# Patient Record
Sex: Female | Born: 2000 | Hispanic: No | Marital: Single | Smoking: Never smoker
Health system: Southern US, Community
[De-identification: ages and names within clinical notes are randomized; demographics above are authoritative.]

## PROBLEM LIST (undated history)

## (undated) DIAGNOSIS — D649 Anemia, unspecified: Secondary | ICD-10-CM

---
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
## 2019-10-17 ENCOUNTER — Inpatient Hospital Stay (HOSPITAL_COMMUNITY)
Admission: AD | Admit: 2019-10-17 | Discharge: 2019-10-18 | Disposition: A | Discharge status: Home / Self Care | Payer: Medicaid Other | Attending: Obstetrics and Gynecology | Admitting: Obstetrics and Gynecology

## 2019-10-17 ENCOUNTER — Encounter (HOSPITAL_COMMUNITY): Payer: Self-pay

## 2019-10-17 ENCOUNTER — Other Ambulatory Visit: Payer: Self-pay

## 2019-10-17 DIAGNOSIS — O209 Hemorrhage in early pregnancy, unspecified: Secondary | ICD-10-CM

## 2019-10-17 DIAGNOSIS — O200 Threatened abortion: Secondary | ICD-10-CM

## 2019-10-17 DIAGNOSIS — Z3A11 11 weeks gestation of pregnancy: Secondary | ICD-10-CM | POA: Insufficient documentation

## 2019-10-17 DIAGNOSIS — Z793 Long term (current) use of hormonal contraceptives: Secondary | ICD-10-CM | POA: Insufficient documentation

## 2019-10-17 DIAGNOSIS — O4691 Antepartum hemorrhage, unspecified, first trimester: Secondary | ICD-10-CM | POA: Diagnosis not present

## 2019-10-17 LAB — POCT PREGNANCY, URINE: Preg Test, Ur: POSITIVE — AB

## 2019-10-17 NOTE — MAU Note (Signed)
Pt states that she has seen pinkish brownish blood 2 times today.   She states she is having abdominal cramping.

## 2019-10-17 NOTE — ED Triage Notes (Signed)
Patient reports vaginal spotting this evening with mild low abdominal discomfort/cramping . She is [redacted] weeks pregnant G1P0 , ambulatory , denies fever , consulted MAU nurse advised triage RN that pt. can be transported to MAU for evaluation .

## 2019-10-18 ENCOUNTER — Inpatient Hospital Stay (HOSPITAL_COMMUNITY): Payer: Medicaid Other

## 2019-10-18 DIAGNOSIS — Z3A11 11 weeks gestation of pregnancy: Secondary | ICD-10-CM

## 2019-10-18 DIAGNOSIS — O200 Threatened abortion: Secondary | ICD-10-CM

## 2019-10-18 DIAGNOSIS — O209 Hemorrhage in early pregnancy, unspecified: Secondary | ICD-10-CM

## 2019-10-18 LAB — URINALYSIS, ROUTINE W REFLEX MICROSCOPIC
Bilirubin Urine: NEGATIVE
Glucose, UA: NEGATIVE mg/dL
Ketones, ur: NEGATIVE mg/dL
Nitrite: NEGATIVE
Protein, ur: 30 mg/dL — AB
Specific Gravity, Urine: 1.025 (ref 1.005–1.030)
pH: 6 (ref 5.0–8.0)

## 2019-10-18 LAB — WET PREP, GENITAL
Sperm: NONE SEEN
Trich, Wet Prep: NONE SEEN
Yeast Wet Prep HPF POC: NONE SEEN

## 2019-10-18 LAB — ABO/RH: ABO/RH(D): O POS

## 2019-10-18 LAB — CBC
HCT: 39.1 % (ref 36.0–46.0)
Hemoglobin: 13.3 g/dL (ref 12.0–15.0)
MCH: 30.5 pg (ref 26.0–34.0)
MCHC: 34 g/dL (ref 30.0–36.0)
MCV: 89.7 fL (ref 80.0–100.0)
Platelets: 259 10*3/uL (ref 150–400)
RBC: 4.36 MIL/uL (ref 3.87–5.11)
RDW: 12.1 % (ref 11.5–15.5)
WBC: 9.6 10*3/uL (ref 4.0–10.5)
nRBC: 0 % (ref 0.0–0.2)

## 2019-10-18 LAB — HCG, QUANTITATIVE, PREGNANCY: hCG, Beta Chain, Quant, S: 5732 m[IU]/mL — ABNORMAL HIGH (ref <5)

## 2019-10-18 NOTE — MAU Provider Note (Signed)
History     CSN: [id number]  Arrival date and time: 10/17/19 2300   First Provider Initiated Contact with Patient 10/18/19 0023      Chief Complaint  Patient presents with  . Vaginal Bleeding  . Abdominal Pain   Joanna Oliver is a 18 y.o. G1P0 at [redacted]w[redacted]d by Definite LMP of Sept 18, 2020. She plans to complete her [facility] in [facility] and her first appt is on Dec 16th.  She presents today for Vaginal Bleeding and Abdominal Pain.  She states she note spotting "a couple of hours ago" and intermittent cramping throughout the day.  She states that she did not require a pad and that the blood was "kinda brownish...like a light brown."  She did not note any blood or discharge upon arrival.  She reports the cramping is in her lower stomach and has no aggravating or relieving factors.  She denies taking any medications for the cramping.  Patient also denies sexual activity in the past three days.      OB History    Gravida  1   Para      Term      Preterm      AB      Living        SAB      TAB      Ectopic      Multiple      Live Births              History reviewed. No pertinent past medical history.  Past Surgical History:  Procedure Laterality Date  . WISDOM TOOTH EXTRACTION  2017    History reviewed. No pertinent family history.  Social History   Tobacco Use  . Smoking status: Never Smoker  . Smokeless tobacco: Never Used  Substance Use Topics  . Alcohol use: Never    Frequency: Never  . Drug use: Never    Allergies: No Known Allergies  Medications Prior to Admission  Medication Sig Dispense Refill Last Dose  . benzonatate (TESSALON) 100 MG capsule Take 1 capsule (100 mg total) by mouth every 8 (eight) hours. (Patient not taking: Reported on 01/18/2018) 21 capsule 0   . medroxyPROGESTERone (DEPO-PROVERA) 150 MG/ML injection Inject 150 mg into the muscle every 3 (three) months.     Marland Kitchen oseltamivir (TAMIFLU) 75 MG capsule Take 1 capsule (75 mg  total) by mouth every 12 (twelve) hours. (Patient not taking: Reported on 01/18/2018) 10 capsule 0     Review of Systems  Constitutional: Negative for chills and fever.  Respiratory: Negative for cough and shortness of breath.   Gastrointestinal: Positive for abdominal pain. Negative for constipation, diarrhea, nausea and vomiting.  Genitourinary: Positive for vaginal bleeding and vaginal discharge ("Regular discharge"). Negative for difficulty urinating, dysuria and pelvic pain.  Musculoskeletal: Positive for back pain (Lower ).  Neurological: Negative for dizziness, light-headedness and headaches.   Physical Exam   Blood pressure 110/65, pulse 71, temperature 98.6 F (37 C), resp. rate 12, weight 45.5 kg, last menstrual period 07/27/2019.  Physical Exam  Constitutional: She is oriented to person, place, and time. She appears well-developed and well-nourished. No distress.  HENT:  Head: Normocephalic and atraumatic.  Eyes: Conjunctivae are normal.  Neck: Normal range of motion.  Cardiovascular: Normal rate.  Respiratory: Effort normal.  Genitourinary: Uterus is enlarged and tender. Cervix exhibits no motion tenderness, no discharge and no friability.    Vaginal bleeding present.     No vaginal  discharge.  There is bleeding in the vagina.    Genitourinary Comments: Sterile Speculum Exam: -Normal External Genitalia: Non tender, no apparent discharge at introitus.  -Vaginal Vault: Pink mucosa with good rugae. Moderate amt brownish red blood noted and removed with faux swab x 4 -wet prep collected -Cervix:Pink, no lesions, cysts, or polyps.  Appears closed. No active bleeding from os-GC/CT collected -Bimanual Exam:  Anterverted uterus enlarged but not c/w dates, feels ~6-8 week size.  Tender on posterior (vaginal) side per patient.     Musculoskeletal: Normal range of motion.  Neurological: She is alert and oriented to person, place, and time.  Skin: Skin is warm and dry.   Psychiatric: She has a normal mood and affect. Her behavior is normal.    MAU Course  Procedures Results for orders placed or performed during the hospital encounter of 10/17/19 (from the past 24 hour(s))  Urinalysis, Routine w reflex microscopic     Status: Abnormal   Collection Time: 10/17/19 11:44 PM  Result Value Ref Range   Color, Urine YELLOW YELLOW   APPearance HAZY (A) CLEAR   Specific Gravity, Urine 1.025 1.005 - 1.030   pH 6.0 5.0 - 8.0   Glucose, UA NEGATIVE NEGATIVE mg/dL   Hgb urine dipstick MODERATE (A) NEGATIVE   Bilirubin Urine NEGATIVE NEGATIVE   Ketones, ur NEGATIVE NEGATIVE mg/dL   Protein, ur 30 (A) NEGATIVE mg/dL   Nitrite NEGATIVE NEGATIVE   Leukocytes,Ua TRACE (A) NEGATIVE   RBC / HPF 0-5 0 - 5 RBC/hpf   WBC, UA 6-10 0 - 5 WBC/hpf   Bacteria, UA RARE (A) NONE SEEN   Squamous Epithelial / LPF 11-20 0 - 5   Mucus PRESENT   Pregnancy, urine POC     Status: Abnormal   Collection Time: 10/17/19 11:44 PM  Result Value Ref Range   Preg Test, Ur POSITIVE (A) NEGATIVE  ABO/Rh     Status: None   Collection Time: 10/18/19 12:08 AM  Result Value Ref Range   ABO/RH(D) O POS    No rh immune globuloin      NOT A RH IMMUNE GLOBULIN CANDIDATE, PT RH POSITIVE Performed at [facility], [address], [place], [place] [address]   CBC     Status: None   Collection Time: 10/18/19 12:08 AM  Result Value Ref Range   WBC 9.6 4.0 - 10.5 K/uL   RBC 4.36 3.87 - 5.11 MIL/uL   Hemoglobin 13.3 12.0 - 15.0 g/dL   HCT 39.1 36.0 - 46.0 %   MCV 89.7 80.0 - 100.0 fL   MCH 30.5 26.0 - 34.0 pg   MCHC 34.0 30.0 - 36.0 g/dL   RDW 12.1 11.5 - 15.5 %   Platelets 259 150 - 400 K/uL   nRBC 0.0 0.0 - 0.2 %  Wet prep, genital     Status: Abnormal   Collection Time: 10/18/19 12:37 AM  Result Value Ref Range   Yeast Wet Prep HPF POC NONE SEEN NONE SEEN   Trich, Wet Prep NONE SEEN NONE SEEN   Clue Cells Wet Prep HPF POC PRESENT (A) NONE SEEN   WBC, Wet Prep HPF POC FEW (A)  NONE SEEN   Sperm NONE SEEN   hCG, quantitative, pregnancy     Status: Abnormal   Collection Time: 10/18/19 12:40 AM  Result Value Ref Range   hCG, Beta Chain, Quant, S 5,732 (H) <5 mIU/mL   US OB LESS THAN 14 WEEKS WITH OB TRANSVAGINAL  Result Date: 10/18/2019 CLINICAL DATA:  Pregnant patient in first-trimester pregnancy with vaginal bleeding. Gestational age based on last menstrual period 11 weeks 6 days. EXAM: OBSTETRIC <14 WK US AND TRANSVAGINAL OB US TECHNIQUE: Both transabdominal and transvaginal ultrasound examinations were performed for complete evaluation of the gestation as well as the maternal uterus, adnexal regions, and pelvic cul-de-sac. Transvaginal technique was performed to assess early pregnancy. COMPARISON:  None. FINDINGS: Intrauterine gestational sac: Single Yolk sac:  Not Visualized. Embryo:  Not Visualized. Cardiac Activity: Not Visualized. MSD: 18.6 mm   6 w   5 d Subchorionic hemorrhage:  Moderate, surrounding the gestational sac. Maternal uterus/adnexae: Single intrauterine gestational sac in the endometrial canal, slightly irregular in shape. No definite yolk sac or fetal pole. There is a possible amniotic membrane and internal debris. There is also fluid in the endocervical canal. The right ovary appears normal measuring 3.0 x 2.0 x 1.5 cm with blood flow. The left ovary appears normal measuring 1.3 x 2.7 x 1.6 cm with blood flow. No suspicious adnexal mass. No pelvic free fluid. IMPRESSION: 1. Single intrauterine gestational sac in the endometrial canal with mean sac diameter of 18.6 mm. The gestational sac is irregular in shape with possible amniotic membrane. No yolk sac or fetal pole. This is discordant with gestational age based on last menstrual period. Findings are suspicious but not yet definitive for failed pregnancy. Recommend follow-up US in 10-14 days for definitive diagnosis. This recommendation follows SRU consensus guidelines: Diagnostic Criteria for Nonviable  Pregnancy Early in the First Trimester. Malva Limes Engl J Med 2013; 161:0960-45; 369:1443-51. 2. Moderate subchorionic hemorrhage. Small amount of fluid in the endocervical canal. 3. No adnexal mass or findings to suggest ectopic pregnancy. Electronically Signed   By: Narda RutherfordMelanie  Sanford M.D.   On: 10/18/2019 03:01    MDM Pelvic Exam; Wet Prep and GC/CT Labs: UA, UPT, CBC, hCG, ABO Ultrasound Pain Medication Assessment and Plan  18 year old G1P0 at 11.6weeks Vaginal Bleeding  -Labs ordered and collected. -Reviewed POC with patient. -Patient offered and declines pain medication.  -Exam performed and findings discussed.  -Patient informed that uterus size is not c/w with dates. -Will add on hCG to labs -Will send for US and await results.   Cherre RobinsJessica L Rayne Cowdrey, MSN, CNM 10/18/2019, 12:23 AM   Reassessment (2:19 AM)-Late Entry d/t Downtime  -hCG Pending. -Patient informed that d/t downtime there is a delay, but should arrive soon. -Patient also informed of preliminary ultrasound results.  -Discussed how hCG would allow us to correlate and decide on plan of care. -Will await results.   Reassessment (3:05 AM)  -hCG and Final US results return. -In room to discuss findings with patient and FOB. -Informed that findings are consistent with miscarriage in setting of definite LMP by patient.  However, discussed need for follow up hCG on Saturday for definite determination. -Patient given option to follow up at her primary ob office or with [facility] and states she is unsure. -Patient instructed to report to MAU for repeat testing and procedure reviewed.  -Order placed for repeat quant for Saturday Dec 12th. -Questions answered regarding what causes miscarriage. -FOB visibly upset, but patient coping well and has no questions. -Patient and FOB without further questions or concerns. -Bleeding Precautions reviewed. -Encouraged to call or return to MAU if symptoms worsen or with the onset of new symptoms. -Discharged to  home in stable condition.  Cherre RobinsJessica L Mayan Kloepfer MSN, CNM Advanced Practice Provider, [facility] for [facility]

## 2019-10-18 NOTE — Progress Notes (Signed)
Will return to MAU Sat for repeat BHCG or sooner for any concerns

## 2019-10-18 NOTE — Discharge Instructions (Signed)
Threatened Miscarriage  A threatened miscarriage occurs when a woman has vaginal bleeding during the first 20 weeks of pregnancy but the pregnancy has not ended. If you have vaginal bleeding during this time, your health care provider will do tests to make sure you are still pregnant. If the tests show that you are still pregnant and that the developing baby (fetus) inside your uterus is still growing, your condition is considered a threatened miscarriage. A threatened miscarriage does not mean your pregnancy will end, but it does increase the risk of losing your pregnancy (complete miscarriage). What are the causes? The cause of this condition is usually not known. For women who go on to have a complete miscarriage, the most common cause is an abnormal number of chromosomes in the developing baby. Chromosomes are the structures inside cells that hold all of a person's genetic material. What increases the risk? The following lifestyle factors may increase your risk of a miscarriage in early pregnancy:  Smoking.  Drinking excessive amounts of alcohol or caffeine.  Recreational drug use. The following preexisting health conditions may increase your risk of a miscarriage in early pregnancy:  Polycystic ovary syndrome.  Uterine fibroids.  Infections.  Diabetes mellitus. What are the signs or symptoms? Symptoms of this condition include:  Vaginal bleeding.  Mild abdominal pain or cramps. How is this diagnosed? If you have bleeding with or without abdominal pain before 20 weeks of pregnancy, your health care provider will do tests to check whether you are still pregnant. These will include:  Ultrasound. This test uses sound waves to create images of the inside of your uterus. This allows your health care provider to look at your developing baby and other structures, such as your placenta.  Pelvic exam. This is an internal exam of your vagina and cervix.  Measurement of your baby's  heart rate.  Laboratory tests such as blood tests, urine tests, or swabs for infection You may be diagnosed with a threatened miscarriage if:  Ultrasound testing shows that you are still pregnant.  Your babys heart rate is strong.  A pelvic exam shows that the opening between your uterus and your vagina (cervix) is closed.  Blood tests confirm that you are still pregnant. How is this treated? No treatments have been shown to prevent a threatened miscarriage from going on to a complete miscarriage. However, the right home care is important. Follow these instructions at home:  Get plenty of rest.  Do not have sex or use tampons if you have vaginal bleeding.  Do not douche.  Do not smoke or use recreational drugs.  Do not drink alcohol.  Avoid caffeine.  Keep all follow-up prenatal visits as told by your health care provider. This is important. Contact a health care provider if:  You have light vaginal bleeding or spotting while pregnant.  You have abdominal pain or cramping.  You have a fever. Get help right away if:  You have heavy vaginal bleeding.  You have blood clots coming from your vagina.  You pass tissue from your vagina.  You leak fluid, or you have a gush of fluid from your vagina.  You have severe low back pain or abdominal cramps.  You have fever, chills, and severe abdominal pain. Summary  A threatened miscarriage occurs when a woman has vaginal bleeding during the first 20 weeks of pregnancy but the pregnancy has not ended.  The cause of a threatened miscarriage is usually not known.  Symptoms of this condition  may include vaginal bleeding and mild abdominal pain or cramps.  No treatments have been shown to prevent a threatened miscarriage from going on to a complete miscarriage.  Keep all follow-up prenatal visits as told by your health care provider. This is important. This information is not intended to replace advice given to you by your  health care provider. Make sure you discuss any questions you have with your health care provider. Document Released: 10/25/2005 Document Revised: 12/01/2017 Document Reviewed: 01/21/2017 Elsevier Patient Education  2020 [place] for [place] at [facility]       Phone: [phone]  Center for [place] at [place] Phone: [phone]  Center for [employer] at [place]  Phone: [place] for [place] at [facility]  Phone: [place] for [place] at [place]  Phone: [facility]       Phone: [phone]  [facility]    Phone: [phone]   [facility] [place])    Phone: [facility]    Phone: [phone]  [facility]    Phone: [place] [facility]  Phone: [place]    Phone: [phone]  [facility]   Phone: [phone]  [facility]    Phone: [phone]

## 2019-10-18 NOTE — Progress Notes (Signed)
Jessica EMly CNM in earlier to discuss test results and d/c plan. Written and verbal d/c instructions given and understanding voiced. 

## 2019-10-19 LAB — GC/CHLAMYDIA PROBE AMP (~~LOC~~) NOT AT ARMC
Chlamydia: NEGATIVE
Comment: NEGATIVE
Comment: NORMAL
Neisseria Gonorrhea: NEGATIVE

## 2019-10-20 ENCOUNTER — Encounter (HOSPITAL_COMMUNITY): Payer: Self-pay | Admitting: Family Medicine

## 2019-10-20 ENCOUNTER — Inpatient Hospital Stay (HOSPITAL_COMMUNITY)
Admission: AD | Admit: 2019-10-20 | Discharge: 2019-10-20 | Disposition: A | Discharge status: Home / Self Care | Payer: Medicaid Other | Attending: Family Medicine | Admitting: Family Medicine

## 2019-10-20 ENCOUNTER — Other Ambulatory Visit: Payer: Self-pay

## 2019-10-20 DIAGNOSIS — Z3A12 12 weeks gestation of pregnancy: Secondary | ICD-10-CM | POA: Insufficient documentation

## 2019-10-20 DIAGNOSIS — O039 Complete or unspecified spontaneous abortion without complication: Secondary | ICD-10-CM | POA: Insufficient documentation

## 2019-10-20 DIAGNOSIS — O99331 Smoking (tobacco) complicating pregnancy, first trimester: Secondary | ICD-10-CM | POA: Insufficient documentation

## 2019-10-20 LAB — HCG, QUANTITATIVE, PREGNANCY: hCG, Beta Chain, Quant, S: 3663 m[IU]/mL — ABNORMAL HIGH (ref <5)

## 2019-10-20 MED ORDER — TRAMADOL HCL 50 MG PO TABS
100.0000 mg | ORAL_TABLET | Freq: Once | ORAL | Status: AC
  Administered 2019-10-20: 100 mg via ORAL
  Filled 2019-10-20: qty 2

## 2019-10-20 MED ORDER — TRAMADOL HCL 50 MG PO TABS: 50.0000 mg | ORAL_TABLET | Freq: Four times a day (QID) | ORAL | 0 refills | Status: DC | PRN

## 2019-10-20 NOTE — MAU Note (Signed)
Joanna Oliver is a 18 y.o. at [redacted]w[redacted]d here in MAU reporting: here for repeat hcg but is also reporting that she is having more severe abdominal pain since this morning. States her bleeding has also been getting heavier since her last visit. Is wearing a pad and has changed it 1 time today, has not seen any blood clots but does think she may have passed some tissue.  Onset of complaint: progressively getting worse  Pain score: 10/10  Vitals:   10/20/19 1016  BP: (!) 97/59  Pulse: 100  Resp: 20  Temp: 97.9 F (36.6 C)  SpO2: 100%     Lab orders placed from triage: hcg

## 2019-10-20 NOTE — MAU Provider Note (Signed)
Chief Complaint: Abdominal Pain, Vaginal Bleeding, and Follow-up   First Provider Initiated Contact with Patient 10/20/19 1040      SUBJECTIVE HPI: Joanna Oliver is a 18 y.o. G1P0 at [redacted]w[redacted]d by sure LMP who presents to maternity admissions for repeat hcg but reports increased pain and vaginal bleeding today. She initially presented on 10/17/09 with spotting and cramping in early pregnancy and had sure pregnancy dates with quant hcg of 5,732 and [place] with irregular gestational sac, no yolk sac or fetal pole.  Pt reports bleeding and pain increased today and she desires something to treat the pain in MAU.  She has soaked 1 pad with dark bleeding, small clots, and possibly some tissue today.  There are no other symptoms. She has not tried any treatments at home.    HPI  History reviewed. No pertinent past medical history. Past Surgical History:  Procedure Laterality Date  . WISDOM TOOTH EXTRACTION  2017   Social History   Socioeconomic History  . Marital status: Single    Spouse name: Not on file  . Number of children: Not on file  . Years of education: Not on file  . Highest education level: Not on file  Occupational History  . Not on file  Tobacco Use  . Smoking status: Current Some Day Smoker  . Smokeless tobacco: Never Used  Substance and Sexual Activity  . Alcohol use: Never  . Drug use: Never  . Sexual activity: Yes    Birth control/protection: None  Other Topics Concern  . Not on file  Social History Narrative  . Not on file   Social Determinants of Health   Financial Resource Strain:   . Difficulty of Paying Living Expenses: Not on file  Food Insecurity:   . Worried About Programme researcher, broadcasting/film/video in the Last Year: Not on file  . Ran Out of Food in the Last Year: Not on file  Transportation Needs:   . Lack of Transportation (Medical): Not on file  . Lack of Transportation (Non-Medical): Not on file  Physical Activity:   . Days of Exercise per Week: Not on file  . Minutes of  Exercise per Session: Not on file  Stress:   . Feeling of Stress : Not on file  Social Connections:   . Frequency of Communication with Friends and Family: Not on file  . Frequency of Social Gatherings with Friends and Family: Not on file  . Attends Religious Services: Not on file  . Active Member of Clubs or Organizations: Not on file  . Attends Banker Meetings: Not on file  . Marital Status: Not on file  Intimate Partner Violence:   . Fear of Current or Ex-Partner: Not on file  . Emotionally Abused: Not on file  . Physically Abused: Not on file  . Sexually Abused: Not on file   No current facility-administered medications on file prior to encounter.   No current outpatient medications on file prior to encounter.   No Known Allergies  ROS:  Review of Systems  Constitutional: Negative for chills, fatigue and fever.  Respiratory: Negative for shortness of breath.   Cardiovascular: Negative for chest pain.  Gastrointestinal: Positive for abdominal pain.  Genitourinary: Positive for pelvic pain and vaginal bleeding. Negative for difficulty urinating, dysuria, flank pain, vaginal discharge and vaginal pain.  Neurological: Negative for dizziness and headaches.  Psychiatric/Behavioral: Negative.      I have reviewed patient's Past Medical Hx, Surgical Hx, Family Hx, Social Hx, medications  and allergies.   Physical Exam   Patient Vitals for the past 24 hrs:  BP Temp Temp src Pulse Resp SpO2 Height Weight  10/20/19 1034 112/76 -- -- 78 -- -- -- --  10/20/19 1016 (!) 97/59 97.9 F (36.6 C) Oral 100 20 100 % 5\' 4"  (1.626 m) 45.3 kg   Constitutional: Well-developed, well-nourished female in no acute distress.  Cardiovascular: normal rate Respiratory: normal effort GI: Abd soft, non-tender. Pos BS x 4 MS: Extremities nontender, no edema, normal ROM Neurologic: Alert and oriented x 4.  GU: Neg CVAT.  PELVIC EXAM: Moderate bleeding with 2 fox swabs used to  visualize cervix, cervix visually 1-2 cm with large gray clot in cervical os, clot removed with ring forceps and cervix visually closed, bleeding minimal,  vaginal walls and external genitalia normal    LAB RESULTS Results for orders placed or performed during the hospital encounter of 10/20/19 (from the past 24 hour(s))  hCG, quantitative, pregnancy     Status: Abnormal   Collection Time: 10/20/19 10:10 AM  Result Value Ref Range   hCG, Beta Chain, Quant, S 3,663 (H) <5 mIU/mL    --/--/O POS (12/10 0008)  IMAGING US OB LESS THAN 14 WEEKS WITH OB TRANSVAGINAL  Result Date: 10/18/2019 CLINICAL DATA:  Pregnant patient in first-trimester pregnancy with vaginal bleeding. Gestational age based on last menstrual period 11 weeks 6 days. EXAM: OBSTETRIC <14 WK US AND TRANSVAGINAL OB US TECHNIQUE: Both transabdominal and transvaginal ultrasound examinations were performed for complete evaluation of the gestation as well as the maternal uterus, adnexal regions, and pelvic cul-de-sac. Transvaginal technique was performed to assess early pregnancy. COMPARISON:  None. FINDINGS: Intrauterine gestational sac: Single Yolk sac:  Not Visualized. Embryo:  Not Visualized. Cardiac Activity: Not Visualized. MSD: 18.6 mm   6 w   5 d Subchorionic hemorrhage:  Moderate, surrounding the gestational sac. Maternal uterus/adnexae: Single intrauterine gestational sac in the endometrial canal, slightly irregular in shape. No definite yolk sac or fetal pole. There is a possible amniotic membrane and internal debris. There is also fluid in the endocervical canal. The right ovary appears normal measuring 3.0 x 2.0 x 1.5 cm with blood flow. The left ovary appears normal measuring 1.3 x 2.7 x 1.6 cm with blood flow. No suspicious adnexal mass. No pelvic free fluid. IMPRESSION: 1. Single intrauterine gestational sac in the endometrial canal with mean sac diameter of 18.6 mm. The gestational sac is irregular in shape with possible  amniotic membrane. No yolk sac or fetal pole. This is discordant with gestational age based on last menstrual period. Findings are suspicious but not yet definitive for failed pregnancy. Recommend follow-up US in 10-14 days for definitive diagnosis. This recommendation follows SRU consensus guidelines: Diagnostic Criteria for Nonviable Pregnancy Early in the First Trimester. Malva Limes Engl J Med 2013; 161:0960-45; 369:1443-51. 2. Moderate subchorionic hemorrhage. Small amount of fluid in the endocervical canal. 3. No adnexal mass or findings to suggest ectopic pregnancy. Electronically Signed   By: Narda RutherfordMelanie  Sanford M.D.   On: 10/18/2019 03:01    MAU Management/MDM: Orders Placed This Encounter  Procedures  . hCG, quantitative, pregnancy  . Discharge patient    Meds ordered this encounter  Medications  . traMADol (ULTRAM) tablet 100 mg  . traMADol (ULTRAM) 50 MG tablet    Sig: Take 1 tablet (50 mg total) by mouth every 6 (six) hours as needed.    Dispense:  10 tablet    Refill:  0    Order  Specific Question:   Supervising Provider    Answer:   Donnamae Jude [[id number]]    Likely POCs removed on exam in MAU and pt bleeding/pain decreased afterwards. Tramadol 100 mg was given in MAU which resolved remainder of pt pain. Rx for Tramadol 50 mg Q 6 hours PRN x 10 tabs sent to pharmacy.  Hcg with significant drop today from 5732 to 3663 today.  Discussed results with pt, questions answered.  F/U with hcg in 1 week and provider visit in 2 weeks.  Message sent to establish care with [facility]. Pt discharged with strict return precautions.  ASSESSMENT 1. SAB (spontaneous abortion)     PLAN Discharge home Allergies as of 10/20/2019   No Known Allergies     Medication List    TAKE these medications   traMADol 50 MG tablet Commonly known as: ULTRAM Take 1 tablet (50 mg total) by mouth every 6 (six) hours as needed.      Follow-up Information    [facility] Follow up.   Why: The office will call you for  2 visits, a lab visit in 1 week and provider visit in 2 weeks. Return to MAU with worsening symptoms.  Contact information: [place] [phone] [place] Certified Nurse-Midwife 10/20/2019  12:12 PM

## 2019-10-24 LAB — SURGICAL PATHOLOGY

## 2019-10-26 ENCOUNTER — Other Ambulatory Visit: Payer: Medicaid Other

## 2019-10-30 ENCOUNTER — Encounter (HOSPITAL_COMMUNITY): Payer: Self-pay | Admitting: Family Medicine

## 2019-10-30 ENCOUNTER — Inpatient Hospital Stay (HOSPITAL_COMMUNITY): Payer: Medicaid Other

## 2019-10-30 ENCOUNTER — Other Ambulatory Visit: Payer: Self-pay

## 2019-10-30 ENCOUNTER — Inpatient Hospital Stay (HOSPITAL_COMMUNITY)
Admission: AD | Admit: 2019-10-30 | Discharge: 2019-10-30 | Disposition: A | Discharge status: Home / Self Care | Payer: Medicaid Other | Attending: Family Medicine | Admitting: Family Medicine

## 2019-10-30 DIAGNOSIS — O209 Hemorrhage in early pregnancy, unspecified: Secondary | ICD-10-CM | POA: Diagnosis not present

## 2019-10-30 DIAGNOSIS — F17200 Nicotine dependence, unspecified, uncomplicated: Secondary | ICD-10-CM | POA: Diagnosis not present

## 2019-10-30 DIAGNOSIS — O039 Complete or unspecified spontaneous abortion without complication: Secondary | ICD-10-CM | POA: Insufficient documentation

## 2019-10-30 DIAGNOSIS — N939 Abnormal uterine and vaginal bleeding, unspecified: Secondary | ICD-10-CM | POA: Diagnosis present

## 2019-10-30 DIAGNOSIS — O99331 Smoking (tobacco) complicating pregnancy, first trimester: Secondary | ICD-10-CM | POA: Diagnosis not present

## 2019-10-30 DIAGNOSIS — Z3A13 13 weeks gestation of pregnancy: Secondary | ICD-10-CM | POA: Insufficient documentation

## 2019-10-30 LAB — URINALYSIS, ROUTINE W REFLEX MICROSCOPIC
Bacteria, UA: NONE SEEN
Bilirubin Urine: NEGATIVE
Glucose, UA: NEGATIVE mg/dL
Ketones, ur: NEGATIVE mg/dL
Nitrite: NEGATIVE
Protein, ur: 30 mg/dL — AB
RBC / HPF: >50 RBC/hpf — ABNORMAL HIGH (ref 0–5)
Specific Gravity, Urine: 1.011 (ref 1.005–1.030)
pH: 7 (ref 5.0–8.0)

## 2019-10-30 LAB — CBC
HCT: 41.3 % (ref 36.0–46.0)
Hemoglobin: 13.6 g/dL (ref 12.0–15.0)
MCH: 29.6 pg (ref 26.0–34.0)
MCHC: 32.9 g/dL (ref 30.0–36.0)
MCV: 90 fL (ref 80.0–100.0)
Platelets: 283 10*3/uL (ref 150–400)
RBC: 4.59 MIL/uL (ref 3.87–5.11)
RDW: 11.9 % (ref 11.5–15.5)
WBC: 9.1 10*3/uL (ref 4.0–10.5)
nRBC: 0 % (ref 0.0–0.2)

## 2019-10-30 LAB — HCG, QUANTITATIVE, PREGNANCY: hCG, Beta Chain, Quant, S: 420 m[IU]/mL — ABNORMAL HIGH (ref <5)

## 2019-10-30 MED ORDER — MISOPROSTOL 200 MCG PO TABS: 800.0000 ug | ORAL_TABLET | Freq: Once | ORAL | 0 refills | Status: DC

## 2019-10-30 NOTE — MAU Provider Note (Signed)
Chief Complaint: Vaginal Bleeding and Miscarriage   First Provider Initiated Contact with Patient 10/30/19 1840     SUBJECTIVE HPI: Joanna Oliver is a 18 y.o. G1P0 at [redacted]w[redacted]d who presents to Maternity Admissions reporting vaginal bleeding and abdominal pain. Has had bleeding since 12/10. During an MAU visit on 12/12 she was diagnosed with a miscarriage due to a drop in her HCG. During that visit, she passed tissue; pathology report showed that is was decidual tissue, not chorionic villi.  States she had heavy amount of bleeding and passed a large clot today. Has not been saturating pads. Denies fever/chills. Symptoms have worsened somewhat since passing the clot today.   Location: abdomen Quality: cramping Severity: 4/10 on pain scale Duration: 2 weeks Timing: intermittent Modifying factors: none Associated signs and symptoms: vaginal bleeding  History reviewed. No pertinent past medical history. OB History  Gravida Para Term Preterm AB Living  1       1    SAB TAB Ectopic Multiple Live Births  1            # Outcome Date GA Lbr Len/2nd Weight Sex Delivery Anes PTL Lv  1 SAB 10/2019           Past Surgical History:  Procedure Laterality Date  . WISDOM TOOTH EXTRACTION  2017   Social History   Socioeconomic History  . Marital status: Single    Spouse name: Not on file  . Number of children: Not on file  . Years of education: Not on file  . Highest education level: Not on file  Occupational History  . Not on file  Tobacco Use  . Smoking status: Current Some Day Smoker  . Smokeless tobacco: Never Used  Substance and Sexual Activity  . Alcohol use: Never  . Drug use: Never  . Sexual activity: Yes    Birth control/protection: None  Other Topics Concern  . Not on file  Social History Narrative  . Not on file   Social Determinants of Health   Financial Resource Strain:   . Difficulty of Paying Living Expenses: Not on file  Food Insecurity:   . Worried About Brewing technologist in the Last Year: Not on file  . Ran Out of Food in the Last Year: Not on file  Transportation Needs:   . Lack of Transportation (Medical): Not on file  . Lack of Transportation (Non-Medical): Not on file  Physical Activity:   . Days of Exercise per Week: Not on file  . Minutes of Exercise per Session: Not on file  Stress:   . Feeling of Stress : Not on file  Social Connections:   . Frequency of Communication with Friends and Family: Not on file  . Frequency of Social Gatherings with Friends and Family: Not on file  . Attends Religious Services: Not on file  . Active Member of Clubs or Organizations: Not on file  . Attends Banker Meetings: Not on file  . Marital Status: Not on file  Intimate Partner Violence:   . Fear of Current or Ex-Partner: Not on file  . Emotionally Abused: Not on file  . Physically Abused: Not on file  . Sexually Abused: Not on file   History reviewed. No pertinent family history. No current facility-administered medications on file prior to encounter.   Current Outpatient Medications on File Prior to Encounter  Medication Sig Dispense Refill  . Prenatal Vit-Fe Fumarate-FA (PRENATAL MULTIVITAMIN) TABS tablet Take 1 tablet by mouth  daily at 12 noon.    . traMADol (ULTRAM) 50 MG tablet Take 1 tablet (50 mg total) by mouth every 6 (six) hours as needed. 10 tablet 0   No Known Allergies  I have reviewed patient's Past Medical Hx, Surgical Hx, Family Hx, Social Hx, medications and allergies.   Review of Systems  Constitutional: Negative.   Gastrointestinal: Positive for abdominal pain.  Genitourinary: Positive for vaginal bleeding.    OBJECTIVE Patient Vitals for the past 24 hrs:  BP Temp Temp src Pulse Resp SpO2 Height Weight  10/30/19 1826 109/68 98.1 F (36.7 C) Oral 93 16 100 % 5\' 4"  (1.626 m) 45.1 kg   Constitutional: Well-developed, well-nourished female in no acute distress.  Cardiovascular: normal rate & rhythm, no  murmur Respiratory: normal rate and effort. Lung sounds clear throughout GI: Abd soft, non-tender, Pos BS x 4. No guarding or rebound tenderness MS: Extremities nontender, no edema, normal ROM Neurologic: Alert and oriented x 4.     LAB RESULTS Results for orders placed or performed during the hospital encounter of 10/30/19 (from the past 24 hour(s))  CBC     Status: None   Collection Time: 10/30/19  6:55 PM  Result Value Ref Range   WBC 9.1 4.0 - 10.5 K/uL   RBC 4.59 3.87 - 5.11 MIL/uL   Hemoglobin 13.6 12.0 - 15.0 g/dL   HCT 16.141.3 09.636.0 - 04.546.0 %   MCV 90.0 80.0 - 100.0 fL   MCH 29.6 26.0 - 34.0 pg   MCHC 32.9 30.0 - 36.0 g/dL   RDW 40.911.9 81.111.5 - 91.415.5 %   Platelets 283 150 - 400 K/uL   nRBC 0.0 0.0 - 0.2 %    IMAGING US OB Transvaginal  Result Date: 10/30/2019 CLINICAL DATA:  Pregnant patient with vaginal bleeding. EXAM: TRANSVAGINAL OB ULTRASOUND TECHNIQUE: Transvaginal ultrasound was performed for complete evaluation of the gestation as well as the maternal uterus, adnexal regions, and pelvic cul-de-sac. COMPARISON:  Ob ultrasound 10/18/2019. FINDINGS: Intrauterine gestational sac: Not visualized. Endometrial measures 0.8 cm in thickness and is somewhat heterogeneous with areas of flow on Doppler imaging. Yolk sac:  Not visualized. Embryo:  Not visualized. Cardiac Activity: Not applicable. Subchorionic hemorrhage:  None visualized. Maternal uterus/adnexae: Negative. IMPRESSION: Previously seen intrauterine pregnancy is no longer present most consistent with spontaneous abortion. The endometrium is not thickened but there is some flow within it on Doppler imaging which may reflect incomplete abortion/abortion in progress. Electronically Signed   By: Drusilla Kannerhomas  Dalessio M.D.   On: 10/30/2019 19:42    MAU COURSE Orders Placed This Encounter  Procedures  . US OB Transvaginal  . Urinalysis, Routine w reflex microscopic  . CBC  . hCG, quantitative, pregnancy  . Discharge patient   Meds  ordered this encounter  Medications  . misoprostol (CYTOTEC) 200 MCG tablet    Sig: Take 4 tablets (800 mcg total) by mouth once for 1 dose.    Dispense:  4 tablet    Refill:  0    Order Specific Question:   Supervising Provider    Answer:   ERVIN, MICHAEL L [[id number]]    MDM VSS CBC, HCG, & ultrasound ordered RH positive  Pt declines pain medication at this time Vital signs look good & hemoglobin stable. IUP no longer seen on ultrasound. Endometrium not thickened.   Discussed with patient. Offered cytotec to clear out uterus. Pt agreeable. Has appt at [facility] next week for SAB follow up.   ASSESSMENT 1. Miscarriage  2. Vaginal bleeding in pregnancy, first trimester     PLAN Discharge home in stable condition. Bleeding & infection precautions Rx cytotec Keep appt next week  Follow-up Information    [facility] Follow up.   Specialty: Obstetrics and Gynecology Why: return for worsening symptoms Contact information: [address] [phone] [place] [phone]         Allergies as of 10/30/2019   No Known Allergies     Medication List    TAKE these medications   misoprostol 200 MCG tablet Commonly known as: CYTOTEC Take 4 tablets (800 mcg total) by mouth once for 1 dose.   prenatal multivitamin Tabs tablet Take 1 tablet by mouth daily at 12 noon.   traMADol 50 MG tablet Commonly known as: ULTRAM Take 1 tablet (50 mg total) by mouth every 6 (six) hours as needed.        Jorje Guild, NP 10/30/2019  7:52 PM

## 2019-10-30 NOTE — MAU Note (Signed)
.   Joanna Oliver is a 18 y.o. at [redacted]w[redacted]d here in MAU reporting: moderate amount of bright red blood on pad. Patient reporting cramps.  Pain score: 4 Vitals:   10/30/19 1826  BP: 109/68  Pulse: 93  Resp: 16  Temp: 98.1 F (36.7 C)  SpO2: 100%

## 2019-10-30 NOTE — Discharge Instructions (Signed)
Miscarriage °A miscarriage is the loss of an unborn baby (fetus) before the 20th week of pregnancy. Most miscarriages happen during the first 3 months of pregnancy. Sometimes, a miscarriage can happen before a woman knows that she is pregnant. °Having a miscarriage can be an emotional experience. If you have had a miscarriage, talk with your health care provider about any questions you may have about miscarrying, the grieving process, and your plans for future pregnancy. °What are the causes? °A miscarriage may be caused by: °· Problems with the genes or chromosomes of the fetus. These problems make it impossible for the baby to develop normally. They are often the result of random errors that occur early in the development of the baby, and are not passed from parent to child (not inherited). °· Infection of the cervix or uterus. °· Conditions that affect hormone balance in the body. °· Problems with the cervix, such as the cervix opening and thinning before pregnancy is at term (cervical insufficiency). °· Problems with the uterus. These may include: °? A uterus with an abnormal shape. °? Fibroids in the uterus. °? Congenital abnormalities. These are problems that were present at birth. °· Certain medical conditions. °· Smoking, drinking alcohol, or using drugs. °· Injury (trauma). °In many cases, the cause of a miscarriage is not known. °What are the signs or symptoms? °Symptoms of this condition include: °· Vaginal bleeding or spotting, with or without cramps or pain. °· Pain or cramping in the abdomen or lower back. °· Passing fluid, tissue, or blood clots from the vagina. °How is this diagnosed? °This condition may be diagnosed based on: °· A physical exam. °· Ultrasound. °· Blood tests. °· Urine tests. °How is this treated? °Treatment for a miscarriage is sometimes not necessary if you naturally pass all the tissue that was in your uterus. If necessary, this condition may be treated with: °· Dilation and  curettage (D&C). This is a procedure in which the cervix is stretched open and the lining of the uterus (endometrium) is scraped. This is done only if tissue from the fetus or placenta remains in the body (incomplete miscarriage). °· Medicines, such as: °? Antibiotic medicine, to treat infection. °? Medicine to help the body pass any remaining tissue. °? Medicine to reduce (contract) the size of the uterus. These medicines may be given if you have a lot of bleeding. °If you have Rh negative blood and your baby was Rh positive, you will need a shot of a medicine called Rh immunoglobulinto protect your future babies from Rh blood problems. "Rh-negative" and "Rh-positive" refer to whether or not the blood has a specific protein found on the surface of red blood cells (Rh factor). °Follow these instructions at home: °Medicines ° °· Take over-the-counter and prescription medicines only as told by your health care provider. °· If you were prescribed antibiotic medicine, take it as told by your health care provider. Do not stop taking the antibiotic even if you start to feel better. °· Do not take NSAIDs, such as aspirin and ibuprofen, unless they are approved by your health care provider. These medicines can cause bleeding. °Activity °· Rest as directed. Ask your health care provider what activities are safe for you. °· Have someone help with home and family responsibilities during this time. °General instructions °· Keep track of the number of sanitary pads you use each day and how soaked (saturated) they are. Write down this information. °· Monitor the amount of tissue or blood clots that   you pass from your vagina. Save any large amounts of tissue for your health care provider to examine. °· Do not use tampons, douche, or have sex until your health care provider approves. °· To help you and your partner with the process of grieving, talk with your health care provider or seek counseling. °· When you are ready, meet with  your health care provider to discuss any important steps you should take for your health. Also, discuss steps you should take to have a healthy pregnancy in the future. °· Keep all follow-up visits as told by your health care provider. This is important. °Where to find more information °· The American Congress of Obstetricians and Gynecologists: www.acog.org °· U.[place]. Department of Health and Human Services Office of Women’s Health: www.womenshealth.gov °Contact a health care provider if: °· You have a fever or chills. °· You have a foul smelling vaginal discharge. °· You have more bleeding instead of less. °Get help right away if: °· You have severe cramps or pain in your back or abdomen. °· You pass blood clots or tissue from your vagina that is walnut-sized or larger. °· You soak more than 1 regular sanitary pad in an hour. °· You become light-headed or weak. °· You pass out. °· You have feelings of sadness that take over your thoughts, or you have thoughts of hurting yourself. °Summary °· Most miscarriages happen in the first 3 months of pregnancy. Sometimes miscarriage happens before a woman even knows that she is pregnant. °· Follow your health care provider's instruction for home care. Keep all follow-up appointments. °· To help you and your partner with the process of grieving, talk with your health care provider or seek counseling. °This information is not intended to replace advice given to you by your health care provider. Make sure you discuss any questions you have with your health care provider. °Document Released: 04/20/2001 Document Revised: 02/16/2019 Document Reviewed: 11/30/2016 °Elsevier Patient Education © 2020 [employer] ° °

## 2019-11-05 ENCOUNTER — Ambulatory Visit: Payer: Medicaid Other | Admitting: Advanced Practice Midwife

## 2019-11-05 ENCOUNTER — Other Ambulatory Visit: Payer: Medicaid Other

## 2020-05-02 ENCOUNTER — Inpatient Hospital Stay (HOSPITAL_COMMUNITY)
Admission: AD | Admit: 2020-05-02 | Discharge: 2020-05-03 | Disposition: A | Discharge status: Home / Self Care | Payer: Medicaid Other | Attending: Obstetrics & Gynecology | Admitting: Obstetrics & Gynecology

## 2020-05-02 ENCOUNTER — Encounter (HOSPITAL_COMMUNITY): Payer: Self-pay | Admitting: Obstetrics & Gynecology

## 2020-05-02 ENCOUNTER — Other Ambulatory Visit: Payer: Self-pay

## 2020-05-02 DIAGNOSIS — R109 Unspecified abdominal pain: Secondary | ICD-10-CM

## 2020-05-02 DIAGNOSIS — Z3A22 22 weeks gestation of pregnancy: Secondary | ICD-10-CM | POA: Insufficient documentation

## 2020-05-02 DIAGNOSIS — O4702 False labor before 37 completed weeks of gestation, second trimester: Secondary | ICD-10-CM

## 2020-05-02 DIAGNOSIS — O26892 Other specified pregnancy related conditions, second trimester: Secondary | ICD-10-CM

## 2020-05-02 DIAGNOSIS — O99332 Smoking (tobacco) complicating pregnancy, second trimester: Secondary | ICD-10-CM | POA: Insufficient documentation

## 2020-05-02 DIAGNOSIS — F17200 Nicotine dependence, unspecified, uncomplicated: Secondary | ICD-10-CM | POA: Diagnosis not present

## 2020-05-02 LAB — URINALYSIS, ROUTINE W REFLEX MICROSCOPIC
Bilirubin Urine: NEGATIVE
Glucose, UA: NEGATIVE mg/dL
Hgb urine dipstick: NEGATIVE
Ketones, ur: NEGATIVE mg/dL
Nitrite: NEGATIVE
Protein, ur: NEGATIVE mg/dL
Specific Gravity, Urine: 1.01 (ref 1.005–1.030)
pH: 7 (ref 5.0–8.0)

## 2020-05-02 LAB — WET PREP, GENITAL
Clue Cells Wet Prep HPF POC: NONE SEEN
Sperm: NONE SEEN
Trich, Wet Prep: NONE SEEN
Yeast Wet Prep HPF POC: NONE SEEN

## 2020-05-02 NOTE — MAU Note (Signed)
States she woke up from a nap at 2100 and started feeling intermittent cramping that occurred every few minutes.  States it feels like when she'd had a miscarriage.  Denies VB/LOF.  Has not taken anything for her pain.  Reports she is feeling the baby move.

## 2020-05-02 NOTE — MAU Provider Note (Signed)
History     CSN: [id number]  Arrival date and time: 05/02/20 2205   First Provider Initiated Contact with Patient 05/02/20 2315      Chief Complaint  Patient presents with  . Contractions   Joanna Oliver is a 19 y.o. G2P0010 at [redacted]w[redacted]d who receives care at [facility].  She presents today for Abdominal Cramping.  She states she started experiencing the cramping about 9-930pm.  She reports the pain is intermittent and was initially "every few minutes, but is now every 10 minutes." She denies aggravating or relieving factors.  She rates the pain a 10/10 and states that she "breathes through it."  Patient denies vaginal discharge or pain/difficulty with urination.  Patient denies sexual intercourse in the past 3 days.  Patient denies constipation or diarrhea. Patient states she has had one bottle of water today and comments that "water is not for me."  However, patient reports having 5-6 cups of juice today. Patient expresses concern that her mother told her to come in because she "tried to have my brother at 6 months."      OB History    Gravida  2   Para      Term      Preterm      AB  1   Living        SAB  1   TAB      Ectopic      Multiple      Live Births              History reviewed. No pertinent past medical history.  Past Surgical History:  Procedure Laterality Date  . WISDOM TOOTH EXTRACTION  2017    History reviewed. No pertinent family history.  Social History   Tobacco Use  . Smoking status: Current Some Day Smoker  . Smokeless tobacco: Never Used  Vaping Use  . Vaping Use: Never used  Substance Use Topics  . Alcohol use: Never  . Drug use: Never    Allergies: No Known Allergies  Medications Prior to Admission  Medication Sig Dispense Refill Last Dose  . misoprostol (CYTOTEC) 200 MCG tablet Take 4 tablets (800 mcg total) by mouth once for 1 dose. 4 tablet 0   . Prenatal Vit-Fe Fumarate-FA (PRENATAL MULTIVITAMIN) TABS tablet Take 1  tablet by mouth daily at 12 noon.   More than a month at Unknown time  . traMADol (ULTRAM) 50 MG tablet Take 1 tablet (50 mg total) by mouth every 6 (six) hours as needed. 10 tablet 0 More than a month at Unknown time    Review of Systems  Constitutional: Negative for chills and fever.  Respiratory: Negative for cough and shortness of breath.   Gastrointestinal: Positive for abdominal pain. Negative for constipation, diarrhea, nausea and vomiting.  Genitourinary: Negative for difficulty urinating, dysuria, pelvic pain, vaginal bleeding and vaginal discharge.  Musculoskeletal: Positive for back pain.  Neurological: Negative for dizziness, light-headedness and headaches.   Physical Exam   Blood pressure 108/66, pulse 84, temperature 97.9 F (36.6 C), temperature source Oral, resp. rate 17, weight 49.6 kg, last menstrual period 11/24/2019, unknown if currently breastfeeding.  Physical Exam Constitutional:      Appearance: Normal appearance.  HENT:     Head: Normocephalic and atraumatic.  Eyes:     Conjunctiva/sclera: Conjunctivae normal.  Cardiovascular:     Rate and Rhythm: Normal rate and regular rhythm.     Heart sounds: Normal heart sounds.  Pulmonary:  Effort: Pulmonary effort is normal.     Breath sounds: Normal breath sounds.  Abdominal:     Palpations: Abdomen is soft.     Tenderness: There is no abdominal tenderness.  Genitourinary:    Vagina: Vaginal discharge present. No bleeding.     Cervix: Friability present. No cervical motion tenderness, erythema or cervical bleeding.     Uterus: Enlarged (C/w with dates).      Comments: Speculum Exam: -Normal External Genitalia: Non tender, no apparent discharge at introitus.  -Vaginal Vault: Pink mucosa with good rugae. Moderate amt thick white discharge -wet prep collected -Cervix:Pink, no lesions, cysts, or polyps.  Appears closed. Some mild friability.  No active bleeding from os-GC/CT collected -Bimanual Exam:   Closed/Long  Musculoskeletal:     Cervical back: Normal range of motion.  Skin:    General: Skin is warm and dry.  Neurological:     Mental Status: She is alert.  Psychiatric:        Mood and Affect: Mood normal.        Thought Content: Thought content normal.        Judgment: Judgment normal.     MAU Course  Procedures Results for orders placed or performed during the hospital encounter of 05/02/20 (from the past 24 hour(s))  Urinalysis, Routine w reflex microscopic     Status: Abnormal   Collection Time: 05/02/20 10:07 PM  Result Value Ref Range   Color, Urine YELLOW YELLOW   APPearance CLEAR CLEAR   Specific Gravity, Urine 1.010 1.005 - 1.030   pH 7.0 5.0 - 8.0   Glucose, UA NEGATIVE NEGATIVE mg/dL   Hgb urine dipstick NEGATIVE NEGATIVE   Bilirubin Urine NEGATIVE NEGATIVE   Ketones, ur NEGATIVE NEGATIVE mg/dL   Protein, ur NEGATIVE NEGATIVE mg/dL   Nitrite NEGATIVE NEGATIVE   Leukocytes,Ua SMALL (A) NEGATIVE   RBC / HPF 0-5 0 - 5 RBC/hpf   WBC, UA 0-5 0 - 5 WBC/hpf   Bacteria, UA RARE (A) NONE SEEN   Squamous Epithelial / LPF 11-20 0 - 5   Mucus PRESENT   Wet prep, genital     Status: Abnormal   Collection Time: 05/02/20 11:27 PM   Specimen: PATH Cytology Cervicovaginal Ancillary Only  Result Value Ref Range   Yeast Wet Prep HPF POC NONE SEEN NONE SEEN   Trich, Wet Prep NONE SEEN NONE SEEN   Clue Cells Wet Prep HPF POC NONE SEEN NONE SEEN   WBC, Wet Prep HPF POC FEW (A) NONE SEEN   Sperm NONE SEEN     MDM Pelvic Exam with cultures Labs: UA, Wet prep, and GC/CT Assessment and Plan  19 year old, G2P0010  SIUP at 22.6weeks Abdominal Cramping  -Reviewed POC with patient. -Pain medication offered and declined. -Patient reassured that likelihood of PTD is low considering no vaginal bleeding or history PTD.  -Exam performed and findings discussed.  -Cultures collected and pending.  -Will await results.   Cherre Robins 05/02/2020, 11:15 PM   Reassessment  (12:11 AM) -Results return negative. GC/CT Pending -Provider to bedside to discuss with patient. -Patient reports pain has improved. -Discussed safety of tylenol for pain as well as how some cramping may be related to need to defecate or relieved with position change.  -Encouraged increased hydration and rest at home. -Instructed to keep appt as scheduled. -Precautions given. -Encouraged to call or return to MAU if symptoms worsen or with the onset of new symptoms. -Discharged to home in stable  condition.  Maryann Conners MSN, CNM Advanced Practice Provider, Center for [facility]

## 2020-05-03 NOTE — Discharge Instructions (Signed)
Abdominal Pain During Pregnancy  Abdominal pain is common during pregnancy, and has many possible causes. Some causes are more serious than others, and sometimes the cause is not known. Abdominal pain can be a sign that labor is starting. It can also be caused by normal growth and stretching of muscles and ligaments during pregnancy. Always tell your health care provider if you have any abdominal pain. Follow these instructions at home:  Do not have sex or put anything in your vagina until your pain goes away completely.  Get plenty of rest until your pain improves.  Drink enough fluid to keep your urine pale yellow.  Take over-the-counter and prescription medicines only as told by your health care provider.  Keep all follow-up visits as told by your health care provider. This is important. Contact a health care provider if:  Your pain continues or gets worse after resting.  You have lower abdominal pain that: ? Comes and goes at regular intervals. ? Spreads to your back. ? Is similar to menstrual cramps.  You have pain or burning when you urinate. Get help right away if:  You have a fever or chills.  You have vaginal bleeding.  You are leaking fluid from your vagina.  You are passing tissue from your vagina.  You have vomiting or diarrhea that lasts for more than 24 hours.  Your baby is moving less than usual.  You feel very weak or faint.  You have shortness of breath.  You develop severe pain in your upper abdomen. Summary  Abdominal pain is common during pregnancy, and has many possible causes.  If you experience abdominal pain during pregnancy, tell your health care provider right away.  Follow your health care provider's home care instructions and keep all follow-up visits as directed. This information is not intended to replace advice given to you by your health care provider. Make sure you discuss any questions you have with your health care  provider. Document Revised: 02/12/2019 Document Reviewed: 01/27/2017 Elsevier Patient Education  2020 Elsevier Inc.  

## 2020-05-05 LAB — GC/CHLAMYDIA PROBE AMP (~~LOC~~) NOT AT ARMC
Chlamydia: NEGATIVE
Comment: NEGATIVE
Comment: NORMAL
Neisseria Gonorrhea: NEGATIVE

## 2021-04-09 ENCOUNTER — Encounter (HOSPITAL_COMMUNITY): Payer: Self-pay | Admitting: Obstetrics & Gynecology

## 2021-04-09 ENCOUNTER — Inpatient Hospital Stay (HOSPITAL_COMMUNITY): Payer: Medicaid Other

## 2021-04-09 ENCOUNTER — Inpatient Hospital Stay (HOSPITAL_COMMUNITY)
Admission: AD | Admit: 2021-04-09 | Discharge: 2021-04-10 | Disposition: A | Discharge status: Home / Self Care | Payer: Medicaid Other | Attending: Obstetrics & Gynecology | Admitting: Obstetrics & Gynecology

## 2021-04-09 ENCOUNTER — Other Ambulatory Visit: Payer: Self-pay

## 2021-04-09 DIAGNOSIS — R1030 Lower abdominal pain, unspecified: Secondary | ICD-10-CM | POA: Insufficient documentation

## 2021-04-09 DIAGNOSIS — O99891 Other specified diseases and conditions complicating pregnancy: Secondary | ICD-10-CM

## 2021-04-09 DIAGNOSIS — R109 Unspecified abdominal pain: Secondary | ICD-10-CM

## 2021-04-09 DIAGNOSIS — O26891 Other specified pregnancy related conditions, first trimester: Secondary | ICD-10-CM

## 2021-04-09 DIAGNOSIS — O368310 Maternal care for abnormalities of the fetal heart rate or rhythm, first trimester, not applicable or unspecified: Secondary | ICD-10-CM | POA: Diagnosis not present

## 2021-04-09 DIAGNOSIS — N760 Acute vaginitis: Secondary | ICD-10-CM

## 2021-04-09 DIAGNOSIS — Z3A10 10 weeks gestation of pregnancy: Secondary | ICD-10-CM | POA: Diagnosis not present

## 2021-04-09 DIAGNOSIS — O23591 Infection of other part of genital tract in pregnancy, first trimester: Secondary | ICD-10-CM | POA: Diagnosis not present

## 2021-04-09 DIAGNOSIS — B9689 Other specified bacterial agents as the cause of diseases classified elsewhere: Secondary | ICD-10-CM

## 2021-04-09 DIAGNOSIS — Z3A01 Less than 8 weeks gestation of pregnancy: Secondary | ICD-10-CM

## 2021-04-09 HISTORY — DX: Anemia, unspecified: D64.9

## 2021-04-09 LAB — URINALYSIS, ROUTINE W REFLEX MICROSCOPIC
Bacteria, UA: NONE SEEN
Bilirubin Urine: NEGATIVE
Glucose, UA: NEGATIVE mg/dL
Hgb urine dipstick: NEGATIVE
Ketones, ur: NEGATIVE mg/dL
Nitrite: NEGATIVE
Protein, ur: NEGATIVE mg/dL
Specific Gravity, Urine: 1.019 (ref 1.005–1.030)
pH: 7 (ref 5.0–8.0)

## 2021-04-09 MED ORDER — ACETAMINOPHEN 500 MG PO TABS
1000.0000 mg | ORAL_TABLET | Freq: Once | ORAL | Status: AC
  Administered 2021-04-10: 1000 mg via ORAL
  Filled 2021-04-09: qty 2

## 2021-04-09 NOTE — MAU Provider Note (Signed)
History     CSN: [id number]  Arrival date and time: 04/09/21 2231   Event Date/Time   First Provider Initiated Contact with Patient 04/09/21 2325      Chief Complaint  Patient presents with  . Abdominal Pain   Joanna Oliver is a 20 y.o. G3P1011 at [redacted]w[redacted]d by Definite LMP of January 29, 2021 who receives care at [facility].   She presents today for Abdominal Pain.  She states she started experiencing lwoer abdominal cramping ~2 hours ago.  She reports the pain is intermittent and denies vaginal bleeding or discharge.  She rates the pain a 8-10/10.  She reports the pain is not relieved or aggravated with any known factors.   OB History    Gravida  3   Para  1   Term  1   Preterm      AB  1   Living  1     SAB  1   IAB      Ectopic      Multiple      Live Births  1           Past Medical History:  Diagnosis Date  . Anemia     Past Surgical History:  Procedure Laterality Date  . WISDOM TOOTH EXTRACTION  2017    No family history on file.  Social History   Tobacco Use  . Smoking status: Never Smoker  . Smokeless tobacco: Never Used  Vaping Use  . Vaping Use: Former  Substance Use Topics  . Alcohol use: Never  . Drug use: Never    Allergies: No Known Allergies  Medications Prior to Admission  Medication Sig Dispense Refill Last Dose  . Prenatal Vit-Fe Fumarate-FA (PRENATAL MULTIVITAMIN) TABS tablet Take 1 tablet by mouth daily at 12 noon.   04/09/2021 at Unknown time    Review of Systems  Gastrointestinal: Positive for abdominal pain (Lower cramping). Negative for nausea and vomiting.  Genitourinary: Negative for difficulty urinating, dysuria, vaginal bleeding and vaginal discharge.   Physical Exam   Blood pressure 103/70, pulse 88, temperature 98.8 F (37.1 C), resp. rate 18, height 5\' 5"  (1.651 m), weight 45.8 kg, last menstrual period 01/29/2021, unknown if currently breastfeeding.  Physical Exam Vitals reviewed.  Constitutional:       Appearance: Normal appearance. She is well-developed.  HENT:     Head: Normocephalic and atraumatic.  Eyes:     Conjunctiva/sclera: Conjunctivae normal.  Cardiovascular:     Rate and Rhythm: Normal rate and regular rhythm.  Pulmonary:     Effort: Pulmonary effort is normal. No respiratory distress.     Breath sounds: Normal breath sounds.  Abdominal:     General: Bowel sounds are normal.     Palpations: Abdomen is soft.     Tenderness: There is abdominal tenderness in the right lower quadrant, suprapubic area and left lower quadrant.  Musculoskeletal:        General: Normal range of motion.     Cervical back: Normal range of motion.  Skin:    General: Skin is warm and dry.  Neurological:     Mental Status: She is alert and oriented to person, place, and time.  Psychiatric:        Mood and Affect: Mood normal.        Behavior: Behavior normal.        Thought Content: Thought content normal.     MAU Course  Procedures Results for orders placed or performed  during the hospital encounter of 04/09/21 (from the past 24 hour(s))  Urinalysis, Routine w reflex microscopic Urine, Clean Catch     Status: Abnormal   Collection Time: 04/09/21 11:27 PM  Result Value Ref Range   Color, Urine YELLOW YELLOW   APPearance CLEAR CLEAR   Specific Gravity, Urine 1.019 1.005 - 1.030   pH 7.0 5.0 - 8.0   Glucose, UA NEGATIVE NEGATIVE mg/dL   Hgb urine dipstick NEGATIVE NEGATIVE   Bilirubin Urine NEGATIVE NEGATIVE   Ketones, ur NEGATIVE NEGATIVE mg/dL   Protein, ur NEGATIVE NEGATIVE mg/dL   Nitrite NEGATIVE NEGATIVE   Leukocytes,Ua TRACE (A) NEGATIVE   RBC / HPF 0-5 0 - 5 RBC/hpf   WBC, UA 0-5 0 - 5 WBC/hpf   Bacteria, UA NONE SEEN NONE SEEN   Squamous Epithelial / LPF 6-10 0 - 5   Mucus PRESENT   Wet prep, genital     Status: Abnormal   Collection Time: 04/09/21 11:39 PM   Specimen: Vaginal  Result Value Ref Range   Yeast Wet Prep HPF POC NONE SEEN NONE SEEN   Trich, Wet Prep NONE SEEN  NONE SEEN   Clue Cells Wet Prep HPF POC PRESENT (A) NONE SEEN   WBC, Wet Prep HPF POC MANY (A) NONE SEEN   Sperm NONE SEEN    US OB LESS THAN 14 WEEKS WITH OB TRANSVAGINAL  Result Date: 04/10/2021 CLINICAL DATA:  Cramping EXAM: OBSTETRIC <14 WK [place] AND TRANSVAGINAL OB US TECHNIQUE: Both transabdominal and transvaginal ultrasound examinations were performed for complete evaluation of the gestation as well as the maternal uterus, adnexal regions, and pelvic cul-de-sac. Transvaginal technique was performed to assess early pregnancy. COMPARISON:  None. FINDINGS: Intrauterine gestational sac: Single intrauterine gestational sac Yolk sac:  Visualized Embryo:  Visualized Cardiac Activity: Visualized Heart Rate: 84 bpm CRL: 2.1 mm   5 w   5 d                  [place] EDC: 12/05/2021 Subchorionic hemorrhage:  None visualized. Maternal uterus/adnexae: Ovaries are within normal limits. Left ovary measures 2.6 x 1.8 x 1.7 cm and contains corpus luteum. The right ovary measures 1.3 x 2.1 x 1.2 cm. No significant free fluid. IMPRESSION: 1. Single viable intrauterine pregnancy as above. There is fetal bradycardia. 2. Otherwise no specific abnormality is seen Electronically Signed   By: Jasmine Pang M.D.   On: 04/10/2021 00:18     MDM Exam Wet Prep and GC/CT Labs: None Ultrasound Analgesic Assessment and Plan  20 year old G3P1011 at 10 weeks Abdominal Pain/Cramping  -Nurse reports she was unable to doppler FHTs. -Review of visit, at [facility], shows unable to doppler heart rate, but movement seen. -Patient was offered and accepts pain medication. -Exam performed -Will send for [place] and await results.  Cherre Robins 04/09/2021, 11:25 PM   Reassessment (12:27 AM) SIUP at 5.5 weeks +Clue Cells  -Results as above. -Provider to bedside to discuss. -Informed that findings are suggestive of bacterial vaginosis. -Rx sent to pharmacy on file.  -Reviewed US findings and informed that change in EDD to  occur. -Patient reports that she has been measuring between 5&6 weeks for the past 3 weeks! -States that fetus has always had bradycardia. -Patient informed that provider not sure why this is occurring or how it is to be managed.   -Patient instructed to follow up with primary ob as scheduled or seek another opinion. -Patient given list of community providers.  -  Encouraged to call or return to MAU if symptoms worsen or with the onset of new symptoms. -Discharged to home in stable condition.  Cherre Robins MSN, CNM Advanced Practice Provider, Center for [facility]

## 2021-04-09 NOTE — MAU Note (Signed)
Pt reports she stared having abd cramping about 2 hours ago. Feels like menstaral cramps. Denies any vaginal bleeding and reports normal vag discharge.

## 2021-04-10 LAB — WET PREP, GENITAL
Sperm: NONE SEEN
Trich, Wet Prep: NONE SEEN
Yeast Wet Prep HPF POC: NONE SEEN

## 2021-04-10 LAB — GC/CHLAMYDIA PROBE AMP (~~LOC~~) NOT AT ARMC
Chlamydia: NEGATIVE
Comment: NEGATIVE
Comment: NORMAL
Neisseria Gonorrhea: NEGATIVE

## 2021-04-10 MED ORDER — METRONIDAZOLE 500 MG PO TABS: 500.0000 mg | ORAL_TABLET | Freq: Two times a day (BID) | ORAL | 0 refills | Status: DC

## 2021-04-10 NOTE — Discharge Instructions (Signed)
Bacterial Vaginosis  Bacterial vaginosis is an infection that occurs when the normal balance of bacteria in the vagina changes. This change is caused by an overgrowth of certain bacteria in the vagina. Bacterial vaginosis is the most common vaginal infection among females aged 21 to 44 years. This condition increases the risk of sexually transmitted infections (STIs). Treatment can help reduce this risk. Treatment is very important for pregnant women because this condition can cause babies to be born early (prematurely) or at a low birth weight. What are the causes? This condition is caused by an increase in harmful bacteria that are normally present in small amounts in the vagina. However, the exact reason this condition develops is not known. You cannot get bacterial vaginosis from toilet seats, bedding, swimming pools, or contact with objects around you. What increases the risk? The following factors may make you more likely to develop this condition:  Having a new sexual partner or multiple sexual partners, or having unprotected sex.  Douching.  Having an intrauterine device (IUD).  Smoking.  Abusing drugs and alcohol. This may lead to riskier sexual behavior.  Taking certain antibiotic medicines.  Being pregnant. What are the signs or symptoms? Some women with this condition have no symptoms. Symptoms may include:  Gray or white vaginal discharge. The discharge can be watery or foamy.  A fish-like odor with discharge, especially after sex or during menstruation.  Itching in and around the vagina.  Burning or pain with urination. How is this diagnosed? This condition is diagnosed based on:  Your medical history.  A physical exam of the vagina.  Checking a sample of vaginal fluid for harmful bacteria or abnormal cells. How is this treated? This condition is treated with antibiotic medicines. These may be given as a pill, a vaginal cream, or a medicine that is put into the  vagina (suppository). If the condition comes back after treatment, a second round of antibiotics may be needed. Follow these instructions at home: Medicines  Take or apply over-the-counter and prescription medicines only as told by your health care provider.  Take or apply your antibiotic medicine as told by your health care provider. Do not stop using the antibiotic even if you start to feel better. General instructions  If you have a female sexual partner, tell her that you have a vaginal infection. She should follow up with her health care provider. If you have a female sexual partner, he does not need treatment.  Avoid sexual activity until you finish treatment.  Drink enough fluid to keep your urine pale yellow.  Keep the area around your vagina and rectum clean. ? Wash the area daily with warm water. ? Wipe yourself from front to back after using the toilet.  If you are breastfeeding, talk to your health care provider about continuing breastfeeding during treatment.  Keep all follow-up visits. This is important. How is this prevented? Self-care  Do not douche.  Wash the outside of your vagina with warm water only.  Wear cotton or cotton-lined underwear.  Avoid wearing tight pants and pantyhose, especially during the summer. Safe sex  Use protection when having sex. This includes: ? Using condoms. ? Using dental dams. This is a thin layer of a material made of latex or polyurethane that protects the mouth during oral sex.  Limit the number of sexual partners. To help prevent bacterial vaginosis, it is best to have sex with just one partner (monogamous relationship).  Make sure you and your sexual partner   are tested for STIs. Drugs and alcohol  Do not use any products that contain nicotine or tobacco. These products include cigarettes, chewing tobacco, and vaping devices, such as e-cigarettes. If you need help quitting, ask your health care provider.  Do not use  drugs.  Do not drink alcohol if: ? Your health care provider tells you not to do this. ? You are pregnant, may be pregnant, or are planning to become pregnant.  If you drink alcohol: ? Limit how much you have to 0-1 drink a day. ? Be aware of how much alcohol is in your drink. In the [place] one drink equals one 12 oz bottle of beer (355 mL), one 5 oz glass of wine (148 mL), or one 1 oz glass of hard liquor (44 mL). Where to find more information  Centers for Disease Control and Prevention: www.cdc.gov  American Sexual Health Association (ASHA): www.ashastd.org  [place].S. Department of Health and Human Services, Office on Women's Health: www.womenshealth.gov Contact a health care provider if:  Your symptoms do not improve, even after treatment.  You have more discharge or pain when urinating.  You have a fever or chills.  You have pain in your abdomen or pelvis.  You have pain during sex.  You have vaginal bleeding between menstrual periods. Summary  Bacterial vaginosis is a vaginal infection that occurs when the normal balance of bacteria in the vagina changes. It results from an overgrowth of certain bacteria.  This condition increases the risk of sexually transmitted infections (STIs). Getting treated can help reduce this risk.  Treatment is very important for pregnant women because this condition can cause babies to be born early (prematurely) or at low birth weight.  This condition is treated with antibiotic medicines. These may be given as a pill, a vaginal cream, or a medicine that is put into the vagina (suppository). This information is not intended to replace advice given to you by your health care provider. Make sure you discuss any questions you have with your health care provider. Document Revised: 04/24/2020 Document Reviewed: 04/24/2020 Elsevier Patient Education  2021 Elsevier Inc.   [facility] Providers      [facility]  [address], [place], [place] [address]  [phone]  [facility]  [address], [place], [place] [address]  [phone]  [facility]  [address], [place], [place] [address]  [phone]  [facility] [place] [facility]  [address], [place], [place] [address]  [phone]  [facility]  [address] floor), [place], [place] [address]  [phone]  [facility] [address], [place], [place] [address] [phone]  [facility]  [address], [place], [place] [address]  [phone]  [facility]  [address], [place], [place] [address]  [phone]  [facility]  [address], [place], [place] [address]  [phone]  [facility]  [address], [place], [place] [address]  [phone]  [facility]  [address], [place], [place] [address]  [phone]  [facility]  [address], [place], [place] [address]  [phone]  [facility]   [address], [place], [place] [address]  [phone]  [facility]  [address], [place], [place] [address]   [phone]  [facility]  [address], [place], [place] [address]  [phone]           

## 2021-04-14 ENCOUNTER — Inpatient Hospital Stay (HOSPITAL_COMMUNITY)
Admission: AD | Admit: 2021-04-14 | Discharge: 2021-04-15 | Disposition: A | Discharge status: Home / Self Care | Payer: Medicaid Other | Attending: Obstetrics and Gynecology | Admitting: Obstetrics and Gynecology

## 2021-04-14 ENCOUNTER — Other Ambulatory Visit: Payer: Self-pay

## 2021-04-14 DIAGNOSIS — O209 Hemorrhage in early pregnancy, unspecified: Secondary | ICD-10-CM

## 2021-04-14 DIAGNOSIS — Z3A01 Less than 8 weeks gestation of pregnancy: Secondary | ICD-10-CM | POA: Insufficient documentation

## 2021-04-14 DIAGNOSIS — O021 Missed abortion: Secondary | ICD-10-CM | POA: Insufficient documentation

## 2021-04-14 NOTE — MAU Note (Signed)
Pt reprots she had a gush of blood around 11pm. Had some pink spotting earlier in the day. C/o some mild cramping

## 2021-04-15 ENCOUNTER — Encounter (HOSPITAL_COMMUNITY): Payer: Self-pay | Admitting: Obstetrics and Gynecology

## 2021-04-15 ENCOUNTER — Inpatient Hospital Stay (HOSPITAL_COMMUNITY): Payer: Medicaid Other

## 2021-04-15 DIAGNOSIS — O021 Missed abortion: Secondary | ICD-10-CM | POA: Diagnosis present

## 2021-04-15 DIAGNOSIS — Z3A01 Less than 8 weeks gestation of pregnancy: Secondary | ICD-10-CM | POA: Diagnosis not present

## 2021-04-15 MED ORDER — TRAMADOL HCL 50 MG PO TABS: 50.0000 mg | ORAL_TABLET | Freq: Four times a day (QID) | ORAL | 0 refills | Status: DC | PRN

## 2021-04-15 NOTE — MAU Provider Note (Signed)
Chief Complaint: Vaginal Bleeding   Event Date/Time   First Provider Initiated Contact with Patient 04/15/21 0019        SUBJECTIVE HPI: Joanna Oliver is a 20 y.o. G3P1011 at [redacted]w[redacted]d by LMP who presents to maternity admissions reporting gush of brown blood tonight. Has had pink spotting during the day .  Has mild cramping.   She denies vaginal itching/burning, urinary symptoms, h/a, dizziness, n/v, or fever/chills.    Pregnancy has been followed at [facility] where they have done several ultrsounds which found + FHTs but baby never grew.  Pt states baby measured the same for 3 weeks in a row.  Was seen here on 6/2 for cramping and FHR found to be bradycardic.   Vaginal Bleeding The patient's primary symptoms include pelvic pain and vaginal bleeding. The patient's pertinent negatives include no genital itching, genital lesions or genital odor. This is a new problem. The current episode started today. The problem occurs intermittently. The problem has been unchanged. The pain is mild. She is pregnant. Pertinent negatives include no chills, constipation, diarrhea, fever or headaches. The vaginal discharge was bloody. The vaginal bleeding is typical of menses. She has not been passing clots. She has not been passing tissue. Nothing aggravates the symptoms. She has tried nothing for the symptoms.   RN Note: Pt reprots she had a gush of blood around 11pm. Had some pink spotting earlier in the day. C/o some mild cramping  Past Medical History:  Diagnosis Date  . Anemia    Past Surgical History:  Procedure Laterality Date  . WISDOM TOOTH EXTRACTION  2017   Social History   Socioeconomic History  . Marital status: Single    Spouse name: Not on file  . Number of children: Not on file  . Years of education: Not on file  . Highest education level: Not on file  Occupational History  . Not on file  Tobacco Use  . Smoking status: Never Smoker  . Smokeless tobacco: Never Used  Vaping Use  . Vaping  Use: Former  Substance and Sexual Activity  . Alcohol use: Never  . Drug use: Never  . Sexual activity: Yes    Birth control/protection: None  Other Topics Concern  . Not on file  Social History Narrative  . Not on file   Social Determinants of Health   Financial Resource Strain: Not on file  Food Insecurity: Not on file  Transportation Needs: Not on file  Physical Activity: Not on file  Stress: Not on file  Social Connections: Not on file  Intimate Partner Violence: Not on file   No current facility-administered medications on file prior to encounter.   Current Outpatient Medications on File Prior to Encounter  Medication Sig Dispense Refill  . metroNIDAZOLE (FLAGYL) 500 MG tablet Take 1 tablet (500 mg total) by mouth 2 (two) times daily. 14 tablet 0  . Prenatal Vit-Fe Fumarate-FA (PRENATAL MULTIVITAMIN) TABS tablet Take 1 tablet by mouth daily at 12 noon.     No Known Allergies  I have reviewed patient's Past Medical Hx, Surgical Hx, Family Hx, Social Hx, medications and allergies.   ROS:  Review of Systems  Constitutional: Negative for chills and fever.  Gastrointestinal: Negative for constipation and diarrhea.  Genitourinary: Positive for pelvic pain and vaginal bleeding.  Neurological: Negative for headaches.   Review of Systems  Other systems negative   Physical Exam  Physical Exam Patient Vitals for the past 24 hrs:  BP Temp Pulse Resp  Height Weight  04/14/21 2359 106/68 98.6 F (37 C) 81 18 5\' 5"  (1.651 m) 45.4 kg   Constitutional: Well-developed, well-nourished female in no acute distress.  Cardiovascular: normal rate Respiratory: normal effort GI: Abd soft, non-tender.  MS: Extremities nontender, no edema, normal ROM Neurologic: Alert and oriented x 4.  GU: Neg CVAT.  PELVIC EXAM: deferred due to minimal bleeding now and decision to proceed with another ultrasound for definitive evaluation.  LAB RESULTS No results found for this or any previous  visit (from the past 24 hour(s)).    IMAGING OB Transvaginal  Result Date: 04/15/2021 CLINICAL DATA:  Vaginal bleeding. Gestational age by last menstrual period 10 weeks and 6 days. Last menstrual period 01/29/2021. Estimated due date by last menstrual period 11/05/2021. Gestational age by first ultrasound/assigned gestational age of [redacted] weeks and 4 days. Gestational age on ultrasound 04/10/21 weeks and 5 days. EXAM: OBSTETRIC <14 WK 06/10/21 AND TRANSVAGINAL OB US TECHNIQUE: Both transabdominal and transvaginal ultrasound examinations were performed for complete evaluation of the gestation as well as the maternal uterus, adnexal regions, and pelvic cul-de-sac. Transvaginal technique was performed to assess early pregnancy. COMPARISON:  Ultrasound Ob 04/10/2021 FINDINGS: Intrauterine gestational sac: Single slightly irregular shaped Yolk sac:  Visualized. Embryo:  Visualized. Cardiac Activity: Not Visualized. CRL:  2.3 mm   5 w   5 d                  06/10/2021 EDC: 12/11/21 Subchorionic hemorrhage:  None visualized. Maternal uterus/adnexae: Bilaterally ovaries are unremarkable. The uterus is unremarkable. Other: No free pelvic fluid. IMPRESSION: Single intrauterine pregnancy with no cardiac activity visualized and no growth since last ultrasound 04/10/2021. Findings meet definitive criteria for failed pregnancy. This follows SRU consensus guidelines: Diagnostic Criteria for Nonviable Pregnancy Early in the First Trimester. 06/10/2021 J Med [phone]. These results were called by telephone at the time of interpretation on 04/15/2021 at 1:20 am to provider [facility] , who verbally acknowledged these results. Electronically Signed   By: [facility] M.D.   On: 04/15/2021 01:21     MAU Management/MDM: Ordered Ultrasound to rule out SAB Reviewed findings of 06/15/2021 tonight which showed no growth of fetal pole and absence of fetal cardiac activity, previously seen . DIscussed with patient.  She his confused about entire  course of pregnancy. She has an appt there  In 2 days.  Encouraged to followup there and discuss findings  Offered options of expectant management, Cytotec induction of SAB, and D&C She elects to proceed expectantly Discussed process of SAB with bleeding precautions Will Rx Tramadol for pain prn at home  ASSESSMENT Single IUP at [redacted]w[redacted]d Missed abortion  PLAN Discharge home Expectant management of Inevitable SAB Followup in office in [place] as scheduled Rx Tramadol limited Rx for pain with SAB  Pt stable at time of discharge. Encouraged to return here if she develops worsening of symptoms, increase in pain, fever, or other concerning symptoms.    [redacted]w[redacted]d CNM, MSN Certified Nurse-Midwife 04/15/2021  12:19 AM

## 2021-04-15 NOTE — Discharge Instructions (Signed)
Incomplete Miscarriage An incomplete miscarriage happens when tissue from pregnancy or part of the placenta, known as products of conception, remain in the body after a miscarriage. A miscarriage is the loss of pregnancy before the 20th week. Most miscarriages happen in the first 3 months of pregnancy. Sometimes, a miscarriage happens before a woman knows she is pregnant. Having a miscarriage can be an emotional experience. If you have had a miscarriage, talk with your health care provider about any questions you may have about:  The loss of your baby.  The grieving process.  Your future pregnancy plans. What are the causes? Many times, the cause of an incomplete miscarriage is not known. What increases the risk? The following factors may make a pregnant woman more likely to have an incomplete miscarriage:  Using a watch-and-wait approach (expectant management) to treat a miscarriage.  Using medicine to treat a miscarriage. What are the signs or symptoms? Symptoms of this condition include:  Vaginal bleeding or spotting, with or without cramps or pain.  Pain or cramping in the abdomen or lower back.  Fluid or tissue coming out of the vagina. How is this diagnosed? This condition may be diagnosed based on:  A physical exam.  Ultrasound. How is this treated? An incomplete miscarriage may be treated with:  Dilation and curettage (D&C). In this procedure, the cervix is stretched open and any remaining pregnancy tissue is removed from the lining of the uterus (endometrium). The cervix is the lowest part of the uterus, which opens into the vagina.  Medicines. These may include: ? Antibiotic medicine, to treat infection. ? Medicine to help any remaining pregnancy tissue come out of the uterus. ? Medicine to reduce (contract) the size of the uterus. These medicines may be given if there is a lot of bleeding. If you have Rh-negative blood, you may be given an injection of Rho(D) immune  globulin to help prevent problems with future pregnancies. Follow these instructions at home: Medicines  Take over-the-counter and prescription medicines only as told by your health care provider.  If you were prescribed antibiotic medicine, take your antibiotic as told by your health care provider. Do not stop taking the antibiotic even if you start to feel better. Activity  Rest as told by your health care provider. Ask your health care provider what activities are safe for you.  Have someone help with home and family responsibilities during this time. General instructions  Monitor how much tissue or blood comes out of the vagina.  Do not have sex, douche, or put anything in your vagina, such as tampons, until your health care provider says it is okay.  To help you and your partner with the grieving process, talk with your health care provider or get counseling to help deal with the pregnancy loss.  When you are ready, meet with your health care provider to discuss any important steps you should take for your health. Also, discuss steps you should take to have a healthy pregnancy in the future.  Keep all follow-up visits. This is important.   Where to find more information  The American College of Obstetricians and Gynecologists: acog.org  [place] Department of Health and Human Services Office of Women's Health: hrsa.gov/office-womens-health Contact a health care provider if:  You have a fever or chills.  There is bad-smelling fluid coming from your vagina.  You have more bleeding instead of less.  Tissue or blood clots come out of your vagina. Get help right away if:  You   have severe cramps or pain in your back or abdomen.  Heavy bleeding soaks through 2 large sanitary pads an hour for more than 2 hours.  You become light-headed or weak.  You faint.  You feel sad, and your sadness takes over your thoughts.  You think about hurting yourself. If you ever feel like you  may hurt yourself or others, or have thoughts about taking your own life, get help right away. Go to your nearest emergency department or:  Call your local emergency services (911 in the [place]  Call a suicide crisis helpline, such as the National Suicide Prevention Lifeline at 1-800-273-8255. This is open 24 hours a day in the [place]  Text the Crisis Text Line at 741741 (in the [place] Summary  An incomplete miscarriage happens when tissue from pregnancy or part of the placenta, known as products of conception, remain in the body after a miscarriage.  Treatment may include a dilation and curettage (D&C) procedure or medicines. In a D&C procedure, tissue is removed from the uterus.  Rest as told by your health care provider. Ask your health care provider what activities are safe for you.  To help you and your partner with the grieving process, talk with your health care provider or get counseling to help deal with the pregnancy loss. This information is not intended to replace advice given to you by your health care provider. Make sure you discuss any questions you have with your health care provider. Document Revised: 04/25/2020 Document Reviewed: 04/25/2020 Elsevier Patient Education  2021 Elsevier Inc.  

## 2021-12-14 ENCOUNTER — Encounter (HOSPITAL_COMMUNITY): Payer: Self-pay

## 2021-12-14 ENCOUNTER — Emergency Department (HOSPITAL_COMMUNITY): Payer: Medicaid Other

## 2021-12-14 ENCOUNTER — Emergency Department (HOSPITAL_COMMUNITY)
Admission: EM | Admit: 2021-12-14 | Discharge: 2021-12-14 | Disposition: A | Discharge status: Home / Self Care | Payer: Medicaid Other | Attending: Emergency Medicine | Admitting: Emergency Medicine

## 2021-12-14 ENCOUNTER — Other Ambulatory Visit: Payer: Self-pay

## 2021-12-14 DIAGNOSIS — R0602 Shortness of breath: Secondary | ICD-10-CM | POA: Insufficient documentation

## 2021-12-14 DIAGNOSIS — R0789 Other chest pain: Secondary | ICD-10-CM | POA: Diagnosis not present

## 2021-12-14 DIAGNOSIS — R110 Nausea: Secondary | ICD-10-CM | POA: Diagnosis not present

## 2021-12-14 LAB — CBC WITH DIFFERENTIAL/PLATELET
Abs Immature Granulocytes: 0.01 10*3/uL (ref 0.00–0.07)
Basophils Absolute: 0 10*3/uL (ref 0.0–0.1)
Basophils Relative: 1 %
Eosinophils Absolute: 0 10*3/uL (ref 0.0–0.5)
Eosinophils Relative: 0 %
HCT: 36.7 % (ref 36.0–46.0)
Hemoglobin: 11.3 g/dL — ABNORMAL LOW (ref 12.0–15.0)
Immature Granulocytes: 0 %
Lymphocytes Relative: 27 %
Lymphs Abs: 1.5 10*3/uL (ref 0.7–4.0)
MCH: 23.1 pg — ABNORMAL LOW (ref 26.0–34.0)
MCHC: 30.8 g/dL (ref 30.0–36.0)
MCV: 75.1 fL — ABNORMAL LOW (ref 80.0–100.0)
Monocytes Absolute: 0.2 10*3/uL (ref 0.1–1.0)
Monocytes Relative: 3 %
Neutro Abs: 3.8 10*3/uL (ref 1.7–7.7)
Neutrophils Relative %: 69 %
Platelets: 347 10*3/uL (ref 150–400)
RBC: 4.89 MIL/uL (ref 3.87–5.11)
RDW: 16.8 % — ABNORMAL HIGH (ref 11.5–15.5)
WBC: 5.5 10*3/uL (ref 4.0–10.5)
nRBC: 0 % (ref 0.0–0.2)

## 2021-12-14 LAB — BASIC METABOLIC PANEL
Anion gap: 12 (ref 5–15)
BUN: 7 mg/dL (ref 6–20)
CO2: 22 mmol/L (ref 22–32)
Calcium: 9.2 mg/dL (ref 8.9–10.3)
Chloride: 107 mmol/L (ref 98–111)
Creatinine, Ser: 0.92 mg/dL (ref 0.44–1.00)
GFR, Estimated: >60 mL/min (ref >60)
Glucose, Bld: 105 mg/dL — ABNORMAL HIGH (ref 70–99)
Potassium: 3.6 mmol/L (ref 3.5–5.1)
Sodium: 141 mmol/L (ref 135–145)

## 2021-12-14 LAB — I-STAT BETA HCG BLOOD, ED (MC, WL, AP ONLY): I-stat hCG, quantitative: <5 m[IU]/mL (ref <5)

## 2021-12-14 MED ORDER — ALBUTEROL SULFATE HFA 108 (90 BASE) MCG/ACT IN AERS: 2.0000 | INHALATION_SPRAY | RESPIRATORY_TRACT | Status: DC | PRN

## 2021-12-14 NOTE — ED Triage Notes (Signed)
Pt arrived POV c/o SHOB after having a couple shots of alcohol tonight. Pt states this has never happened when she has drank before.

## 2021-12-14 NOTE — ED Provider Notes (Addendum)
[facility] EMERGENCY DEPARTMENT Provider Note   CSN: [id number] Arrival date & time: 12/14/21  0425     History  Chief Complaint  Patient presents with   Shortness of Breath    Joanna Oliver is a 21 y.o. female.  HPI Patient is a 21 year old female who presents to the emergency department due to shortness of breath.  Patient states that around midnight she had 3-4 shots of hard alcohol.  She states that she typically drinks about once per week.  She states that after drinking alcohol she then developed constant shortness of breath.  No modifying factors.  Reports some mild chest tightness but no pain.  Mild nausea without vomiting.  Denies any recent surgery or trauma, history of blood clot, hormone use, or unilateral leg swelling.    Home Medications Prior to Admission medications   Medication Sig Start Date End Date Taking? Authorizing Provider  ferrous sulfate 325 (65 FE) MG tablet Take 325 mg by mouth daily with breakfast.   Yes [provider]  metroNIDAZOLE (FLAGYL) 500 MG tablet Take 1 tablet (500 mg total) by mouth 2 (two) times daily. Patient not taking: Reported on 12/14/2021 04/10/21   Gavin Pound, CNM  traMADol (ULTRAM) 50 MG tablet Take 1 tablet (50 mg total) by mouth every 6 (six) hours as needed. Patient not taking: Reported on 12/14/2021 04/15/21   Seabron Spates, CNM      Allergies    Patient has no known allergies.    Review of Systems   Review of Systems  All other systems reviewed and are negative. Ten systems reviewed and are negative for acute change, except as noted in the HPI.   Physical Exam Updated Vital Signs BP (!) 108/58    Pulse 88    Temp 98 F (36.7 C) (Oral)    Resp 18    Ht 5\' 4"  (1.626 m)    Wt 46.3 kg    LMP 01/29/2021    SpO2 98%    BMI 17.51 kg/m  Physical Exam Vitals and nursing note reviewed.  Constitutional:      General: She is not in acute distress.    Appearance: Normal appearance. She is well-developed. She  is not ill-appearing, toxic-appearing or diaphoretic.     Interventions: She is not intubated. HENT:     Head: Normocephalic and atraumatic.     Right Ear: External ear normal.     Left Ear: External ear normal.     Nose: Nose normal.     Mouth/Throat:     Mouth: Mucous membranes are moist.     Pharynx: Oropharynx is clear. No oropharyngeal exudate or posterior oropharyngeal erythema.  Eyes:     Extraocular Movements: Extraocular movements intact.  Cardiovascular:     Rate and Rhythm: Normal rate and regular rhythm.     Pulses: Normal pulses.     Heart sounds: Normal heart sounds. No murmur heard.   No friction rub. No gallop.     Comments: Regular rate and rhythm without murmurs, rubs, or gallops. Pulmonary:     Effort: Pulmonary effort is normal. No tachypnea, bradypnea, accessory muscle usage or respiratory distress. She is not intubated.     Breath sounds: Normal breath sounds. No stridor. No decreased breath sounds, wheezing, rhonchi or rales.     Comments: Lungs are clear to auscultation bilaterally.  No wheezing, rales, or rhonchi.  Speaking in clear and complete sentences.  No respiratory distress noted. Abdominal:  General: Abdomen is flat.     Palpations: Abdomen is soft.     Tenderness: There is no abdominal tenderness.     Comments: Abdomen is soft and nontender.  Musculoskeletal:        General: Normal range of motion.     Cervical back: Normal range of motion and neck supple. No tenderness.     Right lower leg: No tenderness. No edema.     Left lower leg: No tenderness. No edema.     Comments: 2+ DP pulses.  No pedal edema.  No calf tenderness.  Skin:    General: Skin is warm and dry.  Neurological:     General: No focal deficit present.     Mental Status: She is alert and oriented to person, place, and time.  Psychiatric:        Mood and Affect: Mood normal.        Behavior: Behavior normal.   ED Results / Procedures / Treatments   Labs (all labs ordered  are listed, but only abnormal results are displayed) Labs Reviewed  CBC WITH DIFFERENTIAL/PLATELET - Abnormal; Notable for the following components:      Result Value   Hemoglobin 11.3 (*)    MCV 75.1 (*)    MCH 23.1 (*)    RDW 16.8 (*)    All other components within normal limits  BASIC METABOLIC PANEL - Abnormal; Notable for the following components:   Glucose, Bld 105 (*)    All other components within normal limits  I-STAT BETA HCG BLOOD, ED ([facility], [facility], [facility] ONLY)    EKG EKG Interpretation  Date/Time:  Monday December 14 2021 04:36:01 EST Ventricular Rate:  82 PR Interval:  152 QRS Duration: 78 QT Interval:  386 QTC Calculation: 450 R Axis:   93 Text Interpretation: Normal sinus rhythm Rightward axis No previous ECGs available Confirmed by Addison Lank [phone]) on 12/14/2021 5:10:47 AM  Radiology DG Chest 2 View  Result Date: 12/14/2021 CLINICAL DATA:  21 year old female with history of shortness of breath. EXAM: CHEST - 2 VIEW COMPARISON:  No priors. FINDINGS: Lung volumes are normal. No consolidative airspace disease. No pleural effusions. No pneumothorax. No pulmonary nodule or mass noted. Pulmonary vasculature and the cardiomediastinal silhouette are within normal limits. IMPRESSION: No radiographic evidence of acute cardiopulmonary disease. Electronically Signed   By: Vinnie Langton M.D.   On: 12/14/2021 05:22    Procedures Procedures   Medications Ordered in ED Medications - No data to display  ED Course/ Medical Decision Making/ A&P                           Medical Decision Making Amount and/or Complexity of Data Reviewed Labs: ordered. Radiology: ordered.  Risk Prescription drug management.  Pt is a 21 y.o. female who presents to the emergency department due to shortness of breath that began this evening after taking 3-4 shots of liquor.  Labs: CBC with hemoglobin 11.3, MCV of 75.1, RDW of 16.8. I-STAT bHCG less than 5. BMP with a glucose of  105.  Imaging: Chest x-ray shows no radiographic evidence of acute cardiopulmonary disease.  ECG: Normal sinus rhythm rightward axis.  I, Rayna Sexton, PA-C, personally reviewed and evaluated these images and lab results as part of my medical decision-making.  Unsure the source of the patient's symptoms.  Possibly a degree of GERD given her recent alcohol use.  On my exam her heart was regular rate and  rhythm without murmurs, rubs, or gallops.  Lungs are clear to auscultation bilaterally.  Abdomen is soft and nontender.  Oxygen saturations at 100% on room air and patient speaking in clear and complete sentences.  Patient ambulated with a pulse ox and her saturations remained at 100%.  No respiratory distress noted.  ECG was obtained in triage which is reassuring.  Chest x-ray is negative.  Patient is PERC negative with a low risk Wells score.  Doubt DVT/PE.  Given patient's reassuring ECG, age, and lack of comorbidities, doubt cardiac etiology.  Patient does have a history of anemia and has required iron supplementation in the past.  Her mother requests that basic labs to be obtained.  Feel this is reasonable.  These have since resulted and are reassuring as well.  Hemoglobin mildly decreased but stable at 11.3.  BMP reassuring.  Normal kidney function.  Feel the patient is stable for discharge at this time and she is agreeable.  We discussed return precautions.  Her questions were answered and she was amicable at the time of discharge.  Note: Portions of this report may have been transcribed using voice recognition software. Every effort was made to ensure accuracy; however, inadvertent computerized transcription errors may be present.   Final Clinical Impression(s) / ED Diagnoses Final diagnoses:  Shortness of breath   Rx / DC Orders ED Discharge Orders     None         Rayna Sexton, PA-C 12/14/21 0530    Rayna Sexton, PA-C 12/14/21 0607    Rayna Sexton,  PA-C 12/15/21 0031    Fatima Blank, MD 12/23/21 [phone]

## 2021-12-14 NOTE — ED Provider Notes (Incomplete Revision)
[facility] EMERGENCY DEPARTMENT Provider Note   CSN: [id number] Arrival date & time: 12/14/21  0425     History  Chief Complaint  Patient presents with   Shortness of Breath    Joanna Oliver is a 21 y.o. female.  HPI Patient is a 21 year old female who presents to the emergency department due to shortness of breath.  Patient states that around midnight she had 3-4 shots of hard alcohol.  She states that she typically drinks about once per week.  She states that after drinking alcohol she then developed constant shortness of breath.  No modifying factors.  Reports some mild chest tightness but no pain.  Mild nausea without vomiting.  Denies any recent surgery or trauma, history of blood clot, hormone use, or unilateral leg swelling.    Home Medications Prior to Admission medications   Medication Sig Start Date End Date Taking? Authorizing Provider  ferrous sulfate 325 (65 FE) MG tablet Take 325 mg by mouth daily with breakfast.   Yes [provider]  metroNIDAZOLE (FLAGYL) 500 MG tablet Take 1 tablet (500 mg total) by mouth 2 (two) times daily. Patient not taking: Reported on 12/14/2021 04/10/21   Gavin Pound, CNM  traMADol (ULTRAM) 50 MG tablet Take 1 tablet (50 mg total) by mouth every 6 (six) hours as needed. Patient not taking: Reported on 12/14/2021 04/15/21   Seabron Spates, CNM      Allergies    Patient has no known allergies.    Review of Systems   Review of Systems  All other systems reviewed and are negative. Ten systems reviewed and are negative for acute change, except as noted in the HPI.   Physical Exam Updated Vital Signs BP (!) 140/126    Pulse 86    Temp 97.8 F (36.6 C) (Oral)    Resp 16    Ht 5\' 4"  (1.626 m)    Wt 46.3 kg    LMP 01/29/2021    SpO2 100%    BMI 17.51 kg/m  Physical Exam Vitals and nursing note reviewed.  Constitutional:      General: She is not in acute distress.    Appearance: Normal appearance. She is well-developed.  She is not ill-appearing, toxic-appearing or diaphoretic.     Interventions: She is not intubated. HENT:     Head: Normocephalic and atraumatic.     Right Ear: External ear normal.     Left Ear: External ear normal.     Nose: Nose normal.     Mouth/Throat:     Mouth: Mucous membranes are moist.     Pharynx: Oropharynx is clear. No oropharyngeal exudate or posterior oropharyngeal erythema.  Eyes:     Extraocular Movements: Extraocular movements intact.  Cardiovascular:     Rate and Rhythm: Normal rate and regular rhythm.     Pulses: Normal pulses.     Heart sounds: Normal heart sounds. No murmur heard.   No friction rub. No gallop.     Comments: Regular rate and rhythm without murmurs, rubs, or gallops. Pulmonary:     Effort: Pulmonary effort is normal. No tachypnea, bradypnea, accessory muscle usage or respiratory distress. She is not intubated.     Breath sounds: Normal breath sounds. No stridor. No decreased breath sounds, wheezing, rhonchi or rales.     Comments: Lungs are clear to auscultation bilaterally.  No wheezing, rales, or rhonchi.  Speaking in clear and complete sentences.  No respiratory distress noted. Abdominal:  General: Abdomen is flat.     Palpations: Abdomen is soft.     Tenderness: There is no abdominal tenderness.     Comments: Abdomen is soft and nontender.  Musculoskeletal:        General: Normal range of motion.     Cervical back: Normal range of motion and neck supple. No tenderness.     Right lower leg: No tenderness. No edema.     Left lower leg: No tenderness. No edema.     Comments: 2+ DP pulses.  No pedal edema.  No calf tenderness.  Skin:    General: Skin is warm and dry.  Neurological:     General: No focal deficit present.     Mental Status: She is alert and oriented to person, place, and time.  Psychiatric:        Mood and Affect: Mood normal.        Behavior: Behavior normal.   ED Results / Procedures / Treatments   Labs (all labs  ordered are listed, but only abnormal results are displayed) Labs Reviewed  CBC WITH DIFFERENTIAL/PLATELET - Abnormal; Notable for the following components:      Result Value   Hemoglobin 11.3 (*)    MCV 75.1 (*)    MCH 23.1 (*)    RDW 16.8 (*)    All other components within normal limits  BASIC METABOLIC PANEL  I-STAT BETA HCG BLOOD, ED ([facility], [facility], [facility] ONLY)    EKG EKG Interpretation  Date/Time:  Monday December 14 2021 04:36:01 EST Ventricular Rate:  82 PR Interval:  152 QRS Duration: 78 QT Interval:  386 QTC Calculation: 450 R Axis:   93 Text Interpretation: Normal sinus rhythm Rightward axis No previous ECGs available Confirmed by Addison Lank [phone]) on 12/14/2021 5:10:47 AM  Radiology DG Chest 2 View  Result Date: 12/14/2021 CLINICAL DATA:  21 year old female with history of shortness of breath. EXAM: CHEST - 2 VIEW COMPARISON:  No priors. FINDINGS: Lung volumes are normal. No consolidative airspace disease. No pleural effusions. No pneumothorax. No pulmonary nodule or mass noted. Pulmonary vasculature and the cardiomediastinal silhouette are within normal limits. IMPRESSION: No radiographic evidence of acute cardiopulmonary disease. Electronically Signed   By: Vinnie Langton M.D.   On: 12/14/2021 05:22    Procedures Procedures   Medications Ordered in ED Medications  albuterol (VENTOLIN HFA) 108 (90 Base) MCG/ACT inhaler 2 puff (has no administration in time range)   ED Course/ Medical Decision Making/ A&P                           Medical Decision Making Amount and/or Complexity of Data Reviewed Labs: ordered. Radiology: ordered.  Risk Prescription drug management.  Pt is a 22 y.o. female who presents to the emergency department due to shortness of breath that began this evening after taking 3-4 shots of liquor.  Labs: CBC with hemoglobin 11.3, MCV of 75.1, RDW of 16.8. I-STAT bHCG less than 5. BMP ***.  Imaging: Chest x-ray shows no radiographic evidence  of acute cardiopulmonary disease.  ECG: Normal sinus rhythm rightward axis.  I, Rayna Sexton, PA-C, personally reviewed and evaluated these images and lab results as part of my medical decision-making.  Unsure the source of the patient's symptoms.  Possibly a degree of GERD given her recent alcohol use.  On my exam her heart was regular rate and rhythm without murmurs, rubs, or gallops.  Lungs are clear to auscultation bilaterally.  Abdomen is soft and nontender.  Oxygen saturations at 100% on room air and patient speaking in clear and complete sentences.  Patient ambulated with a pulse ox and her saturations remained at 100%.  No respiratory distress noted.  ECG was obtained in triage which is reassuring.  Chest x-ray is negative.  Patient is PERC negative with a low risk Wells score.  Doubt DVT/PE.  Given patient's reassuring ECG, age, and lack of comorbidities, doubt cardiac etiology.  Patient does have a history of anemia and has required iron supplementation in the past.  Her mother requests that basic labs to be obtained.  Feel this is reasonable.  These have since resulted and are reassuring as well.  Hemoglobin mildly decreased but stable at 11.3. ***  Feel the patient is stable for discharge at this time and she is agreeable.  We discussed return precautions.  Her questions were answered and she was amicable at the time of discharge.  Note: Portions of this report may have been transcribed using voice recognition software. Every effort was made to ensure accuracy; however, inadvertent computerized transcription errors may be present.   Final Clinical Impression(s) / ED Diagnoses Final diagnoses:  Shortness of breath   Rx / DC Orders ED Discharge Orders     None         Rayna Sexton, PA-C 12/14/21 0530    Rayna Sexton, PA-C 12/14/21 [phone]

## 2021-12-14 NOTE — Discharge Instructions (Signed)
Please continue to monitor your symptoms closely.  If you develop any new or worsening symptoms please come back to the emergency department. 

## 2021-12-14 NOTE — ED Notes (Signed)
Pt discharged and ambulated out of the ED without difficulty. 

## 2022-06-10 ENCOUNTER — Other Ambulatory Visit: Payer: Self-pay | Admitting: Hematology and Oncology

## 2022-06-10 DIAGNOSIS — D500 Iron deficiency anemia secondary to blood loss (chronic): Secondary | ICD-10-CM

## 2022-06-11 ENCOUNTER — Encounter: Payer: Self-pay | Admitting: Hematology and Oncology

## 2022-06-11 ENCOUNTER — Inpatient Hospital Stay: Payer: Medicaid Other

## 2022-06-11 ENCOUNTER — Inpatient Hospital Stay: Payer: Medicaid Other | Attending: Hematology and Oncology | Admitting: Hematology and Oncology

## 2022-06-11 DIAGNOSIS — D509 Iron deficiency anemia, unspecified: Secondary | ICD-10-CM | POA: Insufficient documentation

## 2022-06-11 DIAGNOSIS — F1090 Alcohol use, unspecified, uncomplicated: Secondary | ICD-10-CM

## 2022-06-11 DIAGNOSIS — N921 Excessive and frequent menstruation with irregular cycle: Secondary | ICD-10-CM

## 2022-06-11 DIAGNOSIS — D500 Iron deficiency anemia secondary to blood loss (chronic): Secondary | ICD-10-CM

## 2022-06-11 DIAGNOSIS — F32A Depression, unspecified: Secondary | ICD-10-CM

## 2022-06-11 DIAGNOSIS — F419 Anxiety disorder, unspecified: Secondary | ICD-10-CM | POA: Diagnosis not present

## 2022-06-11 LAB — CBC AND DIFFERENTIAL
HCT: 35 — AB (ref 36–46)
Hemoglobin: 11 — AB (ref 12.0–16.0)
Neutrophils Absolute: 4.41
Platelets: 440 10*3/uL — AB (ref 150–400)
WBC: 7.6

## 2022-06-11 LAB — BASIC METABOLIC PANEL
BUN: 11 (ref 4–21)
CO2: 27 — AB (ref 13–22)
Chloride: 103 (ref 99–108)
Creatinine: 0.8 (ref 0.5–1.1)
Glucose: 86
Potassium: 3.7 mEq/L (ref 3.5–5.1)
Sodium: 139 (ref 137–147)

## 2022-06-11 LAB — IRON AND TIBC
Iron: 32 ug/dL (ref 28–170)
Saturation Ratios: 6 % — ABNORMAL LOW (ref 10.4–31.8)
TIBC: 514 ug/dL — ABNORMAL HIGH (ref 250–450)
UIBC: 482 ug/dL

## 2022-06-11 LAB — FERRITIN: Ferritin: 2 ng/mL — ABNORMAL LOW (ref 11–307)

## 2022-06-11 LAB — LACTATE DEHYDROGENASE: LDH: 113 U/L (ref 98–192)

## 2022-06-11 LAB — HEPATIC FUNCTION PANEL
ALT: 13 U/L (ref 7–35)
AST: 25 (ref 13–35)
Alkaline Phosphatase: 67 (ref 25–125)
Bilirubin, Total: 0.7

## 2022-06-11 LAB — COMPREHENSIVE METABOLIC PANEL
Albumin: 4.7 (ref 3.5–5.0)
Calcium: 9.4 (ref 8.7–10.7)

## 2022-06-11 LAB — TSH: TSH: 1.904 u[IU]/mL (ref 0.350–4.500)

## 2022-06-11 LAB — VITAMIN B12: Vitamin B-12: 235 pg/mL (ref 180–914)

## 2022-06-11 LAB — CBC
MCV: 73 — AB (ref 81–99)
RBC: 4.78 (ref 3.87–5.11)

## 2022-06-11 LAB — PROTEIN, TOTAL: Total Protein: 9 g/dL — AB (ref 6.3–8.2)

## 2022-06-11 NOTE — Progress Notes (Signed)
[facility]  [address] [place],  [place]  [address] [phone]  Clinic Day:  06/11/2022  Referring physician: [facility], [facility]   REASON FOR CONSULTATION:  Iron deficiency anemia  HISTORY OF PRESENT ILLNESS:  Joanna Oliver is a 21 y.o. female with iron deficiency anemia who is referred in consultation by Murvin Natal, NP for assessment and management.  The patient states she has been on oral iron supplement, but does not tolerate it due to severe nausea without vomiting.  On July 19, CBC revealed white count 5.7 with a normal differential, hemoglobin 10.6 with an MCV of 74 and platelets 348,000.  On June 5, her hemoglobin was 10.6 with an MCV of 73, normal white blood count and platelets.  Iron studies revealed a serum iron of 16 mcg/dL, TIBC 644 mcg/dL iron saturation 3% and ferritin 2 ng/mL, consistent with iron deficiency.  The patient was placed on oral iron.  She was also found to have vitamin D deficiency for which she is taking high-dose vitamin D weekly.  Of note, her B12 level was low at 168 and the patient states this was not addressed.  TSH and folate were normal.  The patient has irregular menses with intermittent heavy bleeding with clots.  She reports menarche at age 82.  She had been on Depo-Provera in the past with the last injection being about 1 year ago.  She is to be seen by her GYN in the near future. She eats a regular diet.  She is thin and states she has trouble gaining weight despite using nutritional supplements.   She denies having any gastric surgery.  She denies progressive fatigue concerning for worsening anemia.  She denies fevers, chills or night sweats.  She denies continued nausea.  She denies diarrhea or constipation.  She denies melena or hematochezia.  In addition to the above, the patient has anxiety and depression, for which escitalopram is effective.  She has never had any surgery.  I saw her mother earlier this  week for leukopenia, which she has had for about a month, but this had resolved on its own.  There is no family history of blood disorder.  Her paternal great aunt had breast cancer and thyroid cancer at age 3.  Her mother has been referred to genetic counselor for consideration of testing.  REVIEW OF SYSTEMS:  Review of Systems  Constitutional:  Negative for appetite change, chills, fatigue, fever and unexpected weight change.  HENT:   Negative for lump/mass, mouth sores and sore throat.   Respiratory:  Negative for cough and shortness of breath.   Cardiovascular:  Negative for chest pain and leg swelling.  Gastrointestinal:  Negative for abdominal pain, constipation, diarrhea, nausea and vomiting.  Endocrine: Negative for hot flashes.  Genitourinary:  Negative for difficulty urinating, dysuria, frequency and hematuria.   Musculoskeletal:  Negative for arthralgias, back pain and myalgias.  Skin:  Negative for itching and rash.  Neurological:  Negative for dizziness and headaches.  Hematological:  Negative for adenopathy. Does not bruise/bleed easily.  Psychiatric/Behavioral:  Negative for depression and sleep disturbance. The patient is not nervous/anxious.      VITALS:  Blood pressure 107/64, pulse 76, temperature 98.4 F (36.9 C), resp. rate 14, height 5\' 5"  (1.651 m), weight 96 lb 8 oz (43.8 kg), SpO2 100 %, unknown if currently breastfeeding.  Wt Readings from Last 3 Encounters:  06/11/22 96 lb 8 oz (43.8 kg)  12/14/21 102 lb (46.3  kg)  04/14/21 100 lb (45.4 kg)    Body mass index is 16.06 kg/m.  Performance status (ECOG): 0 - Asymptomatic  PHYSICAL EXAM:  Physical Exam Vitals and nursing note reviewed.  Constitutional:      General: She is not in acute distress.    Appearance: Normal appearance.  HENT:     Head: Normocephalic and atraumatic.     Mouth/Throat:     Mouth: Mucous membranes are moist.     Pharynx: Oropharynx is clear. No oropharyngeal exudate or posterior  oropharyngeal erythema.  Eyes:     General: No scleral icterus.    Extraocular Movements: Extraocular movements intact.     Conjunctiva/sclera: Conjunctivae normal.     Pupils: Pupils are equal, round, and reactive to light.  Cardiovascular:     Rate and Rhythm: Normal rate and regular rhythm.     Heart sounds: Normal heart sounds. No murmur heard.    No friction rub. No gallop.  Pulmonary:     Effort: Pulmonary effort is normal.     Breath sounds: Normal breath sounds. No wheezing, rhonchi or rales.  Abdominal:     General: There is no distension.     Palpations: Abdomen is soft. There is no hepatomegaly, splenomegaly or mass.     Tenderness: There is no abdominal tenderness.  Musculoskeletal:        General: Normal range of motion.     Cervical back: Normal range of motion and neck supple. No tenderness.     Right lower leg: No edema.     Left lower leg: No edema.  Lymphadenopathy:     Cervical: Cervical adenopathy (1 cm rubbery, mobile right mid cervical lymph node) present.     Upper Body:     Right upper body: No supraclavicular or axillary adenopathy.     Left upper body: No supraclavicular or axillary adenopathy.     Lower Body: No right inguinal adenopathy. No left inguinal adenopathy.  Skin:    General: Skin is warm and dry.     Coloration: Skin is not jaundiced.     Findings: No rash.  Neurological:     Mental Status: She is alert and oriented to person, place, and time.     Cranial Nerves: No cranial nerve deficit.  Psychiatric:        Mood and Affect: Mood normal.        Behavior: Behavior normal.        Thought Content: Thought content normal.      LABS:      Latest Ref Rng & Units 06/11/2022   12:00 AM 12/14/2021    6:09 AM 10/30/2019    6:55 PM  CBC  WBC  7.6     5.5  9.1   Hemoglobin 12.0 - 16.0 11.0     11.3  13.6   Hematocrit 36 - 46 35     36.7  41.3   Platelets 150 - 400 K/uL 440     347  283      This result is from an external source.       Latest Ref Rng & Units 06/11/2022   12:00 AM 12/14/2021    6:09 AM 01/18/2018    8:54 PM  CMP  Glucose 70 - 99 mg/dL  161  93   BUN 4 - 21 11     7  7    Creatinine 0.5 - 1.1 0.8     0.92  0.73   Sodium  137 - 147 139     141  136   Potassium 3.5 - 5.1 mEq/L 3.7     3.6  3.7   Chloride 99 - 108 103     107  104   CO2 13 - 22 27     22  23    Calcium 8.7 - 10.7 9.4     9.2  9.4   Total Protein 6.3 - 8.2 g/dL 9      8.1   Total Bilirubin 0.3 - 1.2 mg/dL   0.8   Alkaline Phos 25 - 125 67      85   AST 13 - 35 25      20   ALT 7 - 35 U/L 13      9      This result is from an external source.     No results found for: "CEA1", "CEA" / No results found for: "CEA1", "CEA" No results found for: "PSA1" No results found for: " " No results found for: "CAN125"  No results found for: "TOTALPROTELP", "ALBUMINELP", "A1GS", "A2GS", "BETS", "BETA2SER", "GAMS", "MSPIKE", "SPEI" Lab Results  Component Value Date   TIBC 514 (H) 06/11/2022   FERRITIN 2 (L) 06/11/2022   IRONPCTSAT 6 (L) 06/11/2022   Lab Results  Component Value Date   LDH 113 06/11/2022    STUDIES:  No results found.    HISTORY:   Past Medical History:  Diagnosis Date   Anemia    Iron deficiency anemia due to chronic blood loss 06/11/2022    Past Surgical History:  Procedure Laterality Date   WISDOM TOOTH EXTRACTION  2017    Family History  Problem Relation Age of Onset   Heart disease Mother    Heart attack Mother     Social History:  reports that she has never smoked. She has never used smokeless tobacco. She reports current alcohol use. She reports that she does not use drugs.The patient was born and raised in [place].  She is engaged with 1 child who is 76 years old.  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  She is accompanied by her mother today.  Allergies:  Allergies  Allergen Reactions   Magnesium-Containing Compounds     IV MAG    Current Medications: Current Outpatient  Medications  Medication Sig Dispense Refill   escitalopram (LEXAPRO) 10 MG tablet Take 10 mg by mouth daily.     Vitamin D, Ergocalciferol, (DRISDOL) 1.25 MG (50000 UNIT) CAPS capsule Take 50,000 Units by mouth once a week.     No current facility-administered medications for this visit.     ASSESSMENT & PLAN:   Assessment:  Jakaria Porcher is a 21 y.o. female with iron deficiency anemia felt to be most likely due to menstrual bleeding.  She also has an elevated serum protein of uncertain etiology, which was not noted previously.  She has a solitary right neck node that appears benign.  Plan: 1.  I will arrange for the patient to receive IV iron replacement in the form of Feraheme in the upcoming weeks. 2.  We will plan to repeat a complete metabolic panel at her next visit and if her protein remains elevated consider further evaluation. 3.  Follow-up right cervical node.  I discussed the assessment and plan with the patient and her mother.  They were provided an opportunity to ask questions and all were answered.  The patient agreed with  the plan and demonstrated an understanding of the instructions.  The patient was advised to contact our office if she develops symptoms of worsening anemia or other concerns prior to next appointment.    Thank you for the referral.   I provided 30 minutes of face-to-face time during this encounter and > 50% was spent counseling as documented under my assessment and plan.    Adah Perl, PA-C

## 2022-06-25 ENCOUNTER — Encounter: Payer: Self-pay | Admitting: Hematology and Oncology

## 2022-06-25 MED FILL — Ferumoxytol Inj 510 MG/17ML (30 MG/ML) (Elemental Fe): INTRAVENOUS | Qty: 17 | Status: AC

## 2022-06-28 ENCOUNTER — Inpatient Hospital Stay: Payer: Medicaid Other

## 2022-06-28 VITALS — BP 90/48 | HR 83 | Temp 98.5°F | Resp 16 | Wt 98.0 lb

## 2022-06-28 DIAGNOSIS — D500 Iron deficiency anemia secondary to blood loss (chronic): Secondary | ICD-10-CM

## 2022-06-28 DIAGNOSIS — D509 Iron deficiency anemia, unspecified: Secondary | ICD-10-CM | POA: Diagnosis not present

## 2022-06-28 LAB — PREGNANCY, URINE: Preg Test, Ur: NEGATIVE

## 2022-06-28 MED ORDER — FAMOTIDINE IN NACL 20-0.9 MG/50ML-% IV SOLN
20.0000 mg | Freq: Once | INTRAVENOUS | Status: AC
  Administered 2022-06-28: 20 mg via INTRAVENOUS
  Filled 2022-06-28: qty 50

## 2022-06-28 MED ORDER — SODIUM CHLORIDE 0.9 % IV SOLN
510.0000 mg | Freq: Once | INTRAVENOUS | Status: AC
  Administered 2022-06-28: 510 mg via INTRAVENOUS
  Filled 2022-06-28: qty 510

## 2022-06-28 MED ORDER — SODIUM CHLORIDE 0.9% FLUSH: 10.0000 mL | Freq: Once | INTRAVENOUS | Status: DC | PRN

## 2022-06-28 MED ORDER — SODIUM CHLORIDE 0.9% FLUSH: 3.0000 mL | Freq: Once | INTRAVENOUS | Status: DC | PRN

## 2022-06-28 MED ORDER — ALTEPLASE 2 MG IJ SOLR: 2.0000 mg | Freq: Once | INTRAMUSCULAR | Status: DC | PRN

## 2022-06-28 MED ORDER — HEPARIN SOD (PORK) LOCK FLUSH 100 UNIT/ML IV SOLN: 250.0000 [IU] | Freq: Once | INTRAVENOUS | Status: DC | PRN

## 2022-06-28 MED ORDER — ACETAMINOPHEN 325 MG PO TABS
650.0000 mg | ORAL_TABLET | Freq: Once | ORAL | Status: AC
  Administered 2022-06-28: 650 mg via ORAL
  Filled 2022-06-28: qty 2

## 2022-06-28 MED ORDER — HEPARIN SOD (PORK) LOCK FLUSH 100 UNIT/ML IV SOLN: 500.0000 [IU] | Freq: Once | INTRAVENOUS | Status: DC | PRN

## 2022-06-28 MED ORDER — SODIUM CHLORIDE 0.9 % IV SOLN: Freq: Once | INTRAVENOUS | Status: AC

## 2022-06-28 NOTE — Progress Notes (Signed)
Patient discharged ambulatory- denies dizziness, nausea, or "feeling bad:"

## 2022-06-28 NOTE — Patient Instructions (Signed)

## 2022-07-02 MED FILL — Ferumoxytol Inj 510 MG/17ML (30 MG/ML) (Elemental Fe): INTRAVENOUS | Qty: 17 | Status: AC

## 2022-07-05 ENCOUNTER — Inpatient Hospital Stay: Payer: Medicaid Other

## 2022-07-05 VITALS — BP 96/53 | HR 89 | Temp 98.4°F | Resp 20 | Ht 65.0 in | Wt 99.0 lb

## 2022-07-05 DIAGNOSIS — D509 Iron deficiency anemia, unspecified: Secondary | ICD-10-CM | POA: Diagnosis not present

## 2022-07-05 DIAGNOSIS — D500 Iron deficiency anemia secondary to blood loss (chronic): Secondary | ICD-10-CM

## 2022-07-05 MED ORDER — SODIUM CHLORIDE 0.9 % IV SOLN
510.0000 mg | Freq: Once | INTRAVENOUS | Status: AC
  Administered 2022-07-05: 510 mg via INTRAVENOUS
  Filled 2022-07-05: qty 510

## 2022-07-05 MED ORDER — ACETAMINOPHEN 325 MG PO TABS
650.0000 mg | ORAL_TABLET | Freq: Once | ORAL | Status: AC
  Administered 2022-07-05: 650 mg via ORAL
  Filled 2022-07-05: qty 2

## 2022-07-05 MED ORDER — FAMOTIDINE IN NACL 20-0.9 MG/50ML-% IV SOLN
20.0000 mg | Freq: Once | INTRAVENOUS | Status: AC
  Administered 2022-07-05: 20 mg via INTRAVENOUS
  Filled 2022-07-05: qty 50

## 2022-07-05 MED ORDER — SODIUM CHLORIDE 0.9 % IV SOLN: Freq: Once | INTRAVENOUS | Status: AC

## 2022-07-05 NOTE — Patient Instructions (Signed)
Iron Deficiency Anemia, Adult  Iron deficiency anemia is when you do not have enough red blood cells or hemoglobin in your blood. This happens because you have too little iron in your body. Hemoglobin carries oxygen to parts of the body. Anemia can cause your body to not get enough oxygen. What are the causes? Not eating enough foods that have iron in them. The body not being able to take in iron well. Blood loss. What increases the risk? Having menstrual periods. Being pregnant. What are the signs or symptoms? Pale skin, lips, and nails. Weakness, dizziness, and getting tired easily. Feeling like you cannot breathe well when moving (shortness of breath). Cold hands and feet. Mild anemia may not cause any symptoms. How is this treated? This condition is treated by finding out why you do not have enough iron and then getting more iron. It may include: Adding foods to your diet that have a lot of iron. Taking iron pills (supplements). If you are pregnant or breastfeeding, you may need to take extra iron. Your diet often does not provide the amount of iron that you need. Getting more vitamin C in your diet. Vitamin C helps your body take in iron. You may need to take iron pills with a glass of orange juice or vitamin C pills. Medicines to make heavy menstrual periods lighter. Surgery or testing procedures to find what is causing the condition. You may need blood tests to see if treatment is working. If the treatment does not seem to be working, you may need more tests. Follow these instructions at home: Medicines Take over-the-counter and prescription medicines only as told by your doctor. This includes iron pills and vitamins. Taking them as told is important because too much iron can be harmful. Take iron pills when your stomach is empty. If you cannot handle this, take them with food. Do not drink milk or take antacids at the same time as your iron pills. Iron pills may turn your poop  (stool)black. If you cannot handle taking iron pills by mouth, ask your doctor about getting iron through: An IV tube. A shot (injection) into a muscle. Eating and drinking Talk with your doctor before changing the foods you eat. Your doctor may tell you to eat foods that have a lot of iron, such as: Liver. Low-fat (lean) beef. Breads and cereals that have iron added to them. Eggs. Dried fruit. Dark green, leafy vegetables. Eat fresh fruits and vegetables that are high in vitamin C. They help your body use iron. Foods with a lot of vitamin C include: Oranges. Peppers. Tomatoes. Mangoes. Managing constipation If you are taking iron pills, they may cause trouble pooping (constipation). To prevent or treat this, you may need to: Drink enough fluid to keep your pee (urine) pale yellow. Take over-the-counter or prescription medicines. Eat foods that are high in fiber. These include beans, whole grains, and fresh fruits and vegetables. Limit foods that are high in fat and sugar. These include fried or sweet foods. General instructions Return to your normal activities when your doctor says that it is safe. Keep all follow-up visits. Contact a doctor if: You feel like you may vomit (nauseous), or you vomit. You feel weak. You get light-headed when getting up from sitting or lying down. You are sweating for no reason. You have trouble pooping. You have worse breathing with physical activity. You have heaviness in your chest. Get help right away if: You faint. If this happens, do not drive yourself   to the hospital. You have a fast heartbeat, or a heartbeat that does not feel regular. Summary Iron deficiency anemia happens when you have too little iron in your body. This condition is treated by finding out why you do not have enough iron in your body and then getting more iron. Take over-the-counter and prescription medicines only as told by your doctor. Eat fresh fruits and vegetables  that are high in vitamin C. Contact a doctor if you have trouble pooping or feel weak. This information is not intended to replace advice given to you by your health care provider. Make sure you discuss any questions you have with your health care provider. Document Revised: 12/03/2021 Document Reviewed: 12/03/2021 Elsevier Patient Education  2023 [employer] Iron-Rich Diet  Iron is a mineral that helps your body produce hemoglobin. Hemoglobin is a protein in red blood cells that carries oxygen to your body's tissues. Eating too little iron may cause you to feel weak and tired, and it can increase your risk of infection. Iron is naturally found in many foods, and many foods have iron added to them (are iron-fortified). You may need to follow an iron-rich diet if you do not have enough iron in your body due to certain medical conditions. The amount of iron that you need each day depends on your age, your sex, and any medical conditions you have. Follow instructions from your health care provider or a dietitian about how much iron you should eat each day. What are tips for following this plan? Reading food labels Check food labels to see how many milligrams (mg) of iron are in each serving. Cooking Cook foods in pots and pans that are made from iron. Take these steps to make it easier for your body to absorb iron from certain foods: Soak beans overnight before cooking. Soak whole grains overnight and drain them before using. Ferment flours before baking, such as by using yeast in bread dough. Meal planning When you eat foods that contain iron, you should eat them with foods that are high in vitamin C. These include oranges, peppers, tomatoes, potatoes, and mangoes. Vitamin C helps your body absorb iron. Certain foods and drinks prevent your body from absorbing iron properly. Avoid eating these foods in the same meal as iron-rich foods or with iron supplements. These foods include: Coffee, black  tea, and red wine. Milk, dairy products, and foods that are high in calcium. Beans and soybeans. Whole grains. General information Take iron supplements only as told by your health care provider. An overdose of iron can be life-threatening. If you were prescribed iron supplements, take them with orange juice or a vitamin C supplement. When you eat iron-fortified foods or take an iron supplement, you should also eat foods that naturally contain iron, such as meat, poultry, and fish. Eating naturally iron-rich foods helps your body absorb the iron that is added to other foods or contained in a supplement. Iron from animal sources is better absorbed than iron from plant sources. What foods should I eat? Fruits Prunes. Raisins. Eat fruits high in vitamin C, such as oranges, grapefruits, and strawberries, with iron-rich foods. Vegetables Spinach (cooked). Green peas. Broccoli. Fermented vegetables. Eat vegetables high in vitamin C, such as leafy greens, potatoes, bell peppers, and tomatoes, with iron-rich foods. Grains Iron-fortified breakfast cereal. Iron-fortified whole-wheat bread. Enriched rice. Sprouted grains. Meats and other proteins Beef liver. Beef. Turkey. Chicken. Oysters. Shrimp. Tuna. Sardines. Chickpeas. Nuts. Tofu. Pumpkin seeds. Beverages Tomato juice. Fresh orange juice.   Prune juice. Hibiscus tea. Iron-fortified instant breakfast shakes. Sweets and desserts Blackstrap molasses. Seasonings and condiments Tahini. Fermented soy sauce. Other foods Wheat germ. The items listed above may not be a complete list of recommended foods and beverages. Contact a dietitian for more information. What foods should I limit? These are foods that should be limited while eating iron-rich foods as they can reduce the absorption of iron in your body. Grains Whole grains. Bran cereal. Bran flour. Meats and other proteins Soybeans. Products made from soy protein. Black beans. Lentils. Mung  beans. Split peas. Dairy Milk. Cream. Cheese. Yogurt. Cottage cheese. Beverages Coffee. Black tea. Red wine. Sweets and desserts Cocoa. Chocolate. Ice cream. Seasonings and condiments Basil. Oregano. Large amounts of parsley. The items listed above may not be a complete list of foods and beverages you should limit. Contact a dietitian for more information. Summary Iron is a mineral that helps your body produce hemoglobin. Hemoglobin is a protein in red blood cells that carries oxygen to your body's tissues. Iron is naturally found in many foods, and many foods have iron added to them (are iron-fortified). When you eat foods that contain iron, you should eat them with foods that are high in vitamin C. Vitamin C helps your body absorb iron. Certain foods and drinks prevent your body from absorbing iron properly, such as whole grains and dairy products. You should avoid eating these foods in the same meal as iron-rich foods or with iron supplements. This information is not intended to replace advice given to you by your health care provider. Make sure you discuss any questions you have with your health care provider. Document Revised: 10/06/2020 Document Reviewed: 10/06/2020 Elsevier Patient Education  2023 Elsevier Inc.  

## 2022-08-05 ENCOUNTER — Other Ambulatory Visit: Payer: Self-pay | Admitting: Hematology and Oncology

## 2022-08-05 DIAGNOSIS — D500 Iron deficiency anemia secondary to blood loss (chronic): Secondary | ICD-10-CM

## 2022-08-05 NOTE — Progress Notes (Deleted)
[facility]  [address] [place],  [place]  [id number] [phone]  Clinic Day:  08/05/2022  Referring physician: Ricard Dillon, NP  ASSESSMENT & PLAN:   Assessment & Plan: Iron deficiency anemia due to chronic blood loss Iron deficiency felt to be due to menorrhagia.  She was treated with IV Feraheme with a good response.  We will plan to see her back in 3 months for repeat clinical assessment.   The patient understands the plans discussed today and is in agreement with them.  She knows to contact our office if she develops concerns prior to her next appointment.   I provided *** minutes of face-to-face time during this encounter and > 50% was spent counseling as documented under my assessment and plan.    Marvia Pickles, PA-C  [facility] [address] [place] [address] Dept: [phone] Dept Fax: [phone]   No orders of the defined types were placed in this encounter.     CHIEF COMPLAINT:  CC: Iron deficiency anemia  Current Treatment: IV Feraheme  HISTORY OF PRESENT ILLNESS:  Joanna Oliver is a 21 y.o. female with iron deficiency anemia who is referred in consultation by Carlena Sax, NP for assessment and management.  The patient states she has been on oral iron supplement, but does not tolerate it due to severe nausea without vomiting.  On July 19, CBC revealed white count 5.7 with a normal differential, hemoglobin 10.6 with an MCV of 74 and platelets 348,000.  On June 5, her hemoglobin was 10.6 with an MCV of 73, normal white blood count and platelets.  Iron studies revealed a serum iron of 16 mcg/dL, TIBC 464 mcg/dL iron saturation 3% and ferritin 2 ng/mL, consistent with iron deficiency.  The patient was placed on oral iron.  She was also found to have vitamin D deficiency for which she is taking high-dose vitamin D weekly.  Of note, her B12 level was low at  168 and the patient states this was not addressed.  TSH and folate were normal.   The patient has irregular menses with intermittent heavy bleeding with clots.  She reports menarche at age 43.  She had been on Depo-Provera in the past with the last injection being about 1 year ago.  She is to be seen by her GYN in the near future. She eats a regular diet.  She is thin and states she has trouble gaining weight despite using nutritional supplements.   She denies having any gastric surgery.  She denies progressive fatigue concerning for worsening anemia.  She denies fevers, chills or night sweats.  She denies continued nausea.  She denies diarrhea or constipation.  She denies melena or hematochezia.  INTERVAL HISTORY:  Joanna is here today for repeat clinical assessment. She denies fevers or chills. She denies pain. Her appetite is good. Her weight {Weight change:10426}.  REVIEW OF SYSTEMS:  Review of Systems  Constitutional:  Negative for appetite change, chills, fatigue, fever and unexpected weight change.  HENT:   Negative for lump/mass, mouth sores and sore throat.   Respiratory:  Negative for cough and shortness of breath.   Cardiovascular:  Negative for chest pain and leg swelling.  Gastrointestinal:  Negative for abdominal pain, constipation, diarrhea, nausea and vomiting.  Endocrine: Negative for hot flashes.  Genitourinary:  Negative for difficulty urinating, dysuria, frequency and hematuria.   Musculoskeletal:  Negative for arthralgias, back  pain and myalgias.  Skin:  Negative for rash.  Neurological:  Negative for dizziness and headaches.  Hematological:  Negative for adenopathy. Does not bruise/bleed easily.  Psychiatric/Behavioral:  Negative for depression and sleep disturbance. The patient is not nervous/anxious.     VITALS:  unknown if currently breastfeeding.  Wt Readings from Last 3 Encounters:  07/05/22 99 lb (44.9 kg)  06/28/22 98 lb 0.3 oz (44.5 kg)  06/11/22 96 lb 8 oz  (43.8 kg)    There is no height or weight on file to calculate BMI.  Performance status (ECOG): {CHL ONC [id number]  PHYSICAL EXAM:  Physical Exam Vitals and nursing note reviewed.  Constitutional:      General: She is not in acute distress.    Appearance: Normal appearance.  HENT:     Head: Normocephalic and atraumatic.     Mouth/Throat:     Mouth: Mucous membranes are moist.     Pharynx: Oropharynx is clear. No oropharyngeal exudate or posterior oropharyngeal erythema.  Eyes:     General: No scleral icterus.    Extraocular Movements: Extraocular movements intact.     Conjunctiva/sclera: Conjunctivae normal.     Pupils: Pupils are equal, round, and reactive to light.  Cardiovascular:     Rate and Rhythm: Normal rate and regular rhythm.     Heart sounds: Normal heart sounds. No murmur heard.    No friction rub. No gallop.  Pulmonary:     Effort: Pulmonary effort is normal.     Breath sounds: Normal breath sounds. No wheezing, rhonchi or rales.  Abdominal:     General: There is no distension.     Palpations: Abdomen is soft. There is no hepatomegaly, splenomegaly or mass.     Tenderness: There is no abdominal tenderness.  Musculoskeletal:        General: Normal range of motion.     Cervical back: Normal range of motion and neck supple. No tenderness.     Right lower leg: No edema.     Left lower leg: No edema.  Lymphadenopathy:     Cervical: No cervical adenopathy.     Upper Body:     Right upper body: No supraclavicular or axillary adenopathy.     Left upper body: No supraclavicular or axillary adenopathy.     Lower Body: No right inguinal adenopathy. No left inguinal adenopathy.  Skin:    General: Skin is warm and dry.     Coloration: Skin is not jaundiced.     Findings: No rash.  Neurological:     Mental Status: She is alert and oriented to person, place, and time.     Cranial Nerves: No cranial nerve deficit.  Psychiatric:        Mood and Affect: Mood  normal.        Behavior: Behavior normal.        Thought Content: Thought content normal.   LABS:      Latest Ref Rng & Units 06/11/2022   12:00 AM 12/14/2021    6:09 AM 10/30/2019    6:55 PM  CBC  WBC  7.6     5.5  9.1   Hemoglobin 12.0 - 16.0 11.0     11.3  13.6   Hematocrit 36 - 46 35     36.7  41.3   Platelets 150 - 400 K/uL 440     347  283      This result is from an external source.  Latest Ref Rng & Units 06/11/2022   12:00 AM 12/14/2021    6:09 AM 01/18/2018    8:54 PM  CMP  Glucose 70 - 99 mg/dL  105  93   BUN 4 - 21 11     7  7    Creatinine 0.5 - 1.1 0.8     0.92  0.73   Sodium 137 - 147 139     141  136   Potassium 3.5 - 5.1 mEq/L 3.7     3.6  3.7   Chloride 99 - 108 103     107  104   CO2 13 - 22 27     22  23    Calcium 8.7 - 10.7 9.4     9.2  9.4   Total Protein 6.3 - 8.2 g/dL 9      8.1   Total Bilirubin 0.3 - 1.2 mg/dL   0.8   Alkaline Phos 25 - 125 67      85   AST 13 - 35 25      20   ALT 7 - 35 U/L 13      9      This result is from an external source.     No results found for: "CEA1", "CEA" / No results found for: "CEA1", "CEA" No results found for: "PSA1" No results found for: "[id number]" No results found for: "CAN125"  No results found for: "TOTALPROTELP", "ALBUMINELP", "A1GS", "A2GS", "BETS", "BETA2SER", "GAMS", "MSPIKE", "SPEI" Lab Results  Component Value Date   TIBC 514 (H) 06/11/2022   FERRITIN 2 (L) 06/11/2022   IRONPCTSAT 6 (L) 06/11/2022   Lab Results  Component Value Date   LDH 113 06/11/2022    STUDIES:  No results found.    HISTORY:   Past Medical History:  Diagnosis Date  . Anemia   . Iron deficiency anemia due to chronic blood loss 06/11/2022    Past Surgical History:  Procedure Laterality Date  . WISDOM TOOTH EXTRACTION  2017    Family History  Problem Relation Age of Onset  . Heart disease Mother   . Heart attack Mother     Social History:  reports that she has never smoked. She has never used smokeless  tobacco. She reports current alcohol use. She reports that she does not use drugs.The patient is {Blank single:19197::"alone","accompanied by"} *** today.  Allergies:  Allergies  Allergen Reactions  . Magnesium-Containing Compounds     IV MAG    Current Medications: Current Outpatient Medications  Medication Sig Dispense Refill  . cyanocobalamin (VITAMIN B12) 500 MCG tablet Take 500 mcg by mouth daily.    Marland Kitchen escitalopram (LEXAPRO) 10 MG tablet Take 10 mg by mouth daily.    . Vitamin D, Ergocalciferol, (DRISDOL) 1.25 MG (50000 UNIT) CAPS capsule Take 50,000 Units by mouth once a week.     No current facility-administered medications for this visit.

## 2022-08-05 NOTE — Assessment & Plan Note (Deleted)
Iron deficiency felt to be due to menorrhagia.  She was treated with IV Feraheme with a good response.  We will plan to see her back in 3 months for repeat clinical assessment.

## 2022-08-06 ENCOUNTER — Inpatient Hospital Stay: Payer: Medicaid Other

## 2022-08-06 ENCOUNTER — Ambulatory Visit: Payer: Medicaid Other | Admitting: Hematology and Oncology

## 2022-08-06 DIAGNOSIS — D500 Iron deficiency anemia secondary to blood loss (chronic): Secondary | ICD-10-CM

## 2022-08-12 ENCOUNTER — Encounter: Payer: Self-pay | Admitting: Hematology and Oncology

## 2022-08-18 NOTE — Progress Notes (Deleted)
[facility]  [address] [place],  [place]  [address] [phone]  Clinic Day:  08/18/2022  Referring physician: Ricard Dillon, NP  ASSESSMENT & PLAN:   Assessment & Plan: Iron deficiency anemia due to chronic blood loss She is status post IV Feraheme.  Her hemoglobin has improved.  Iron studies are pending from today.  We will plan to see her back in 3 months for repeat clinical assessment.   The patient understands the plans discussed today and is in agreement with them.  She knows to contact our office if she develops concerns prior to her next appointment.   I provided *** minutes of face-to-face time during this encounter and > 50% was spent counseling as documented under my assessment and plan.    Marvia Pickles, PA-C  [facility] AT [facility] [address] [place] [address] Dept: [phone] Dept Fax: [phone]   No orders of the defined types were placed in this encounter.     CHIEF COMPLAINT:  CC: Iron deficiency anemia  Current Treatment: IV Feraheme  HISTORY OF PRESENT ILLNESS:  Joanna Oliver is a 21 y.o. female with iron deficiency anemia, felt to be due to menorrhagia, who I began seeing in August.  She had been on oral iron supplement, but does not tolerate it due to severe nausea without vomiting.  On July 19, CBC revealed white count 5.7 with a normal differential, hemoglobin 10.6 with an MCV of 74 and platelets 348,000.  On June 5, her hemoglobin was 10.6 with an MCV of 73, normal white blood count and platelets.  Iron studies revealed a serum iron of 16 mcg/dL, TIBC 464 mcg/dL iron saturation 3% and ferritin 2 ng/mL, consistent with iron deficiency.  The patient was placed on oral iron.  She was also found to have vitamin D deficiency for which she is taking high-dose vitamin D weekly.  Of note, her B12 level was low at 168 and the patient states this was not  addressed.  TSH and folate were normal.   The patient has irregular menses with intermittent heavy bleeding with clots.  She reports menarche at age 32.  She had been on Depo-Provera in the past with the last injection being about 1 year ago.  She has not been to see her gynecologist.  She denies progressive fatigue concerning for worsening anemia.  She continues to have menorrhagia.  Her last menses was ***.  She denies other overt form of bleeding.  INTERVAL HISTORY:  Joanna is here today for repeat clinical assessment after receiving IV Feraheme in August.  She was scheduled for follow-up in September, but rescheduled due to illness. She denies fevers or chills. She denies pain. Her appetite is good. Her weight {Weight change:10426}.  REVIEW OF SYSTEMS:  Review of Systems  Constitutional:  Negative for appetite change, chills, fatigue, fever and unexpected weight change.  HENT:   Negative for lump/mass, mouth sores and sore throat.   Respiratory:  Negative for cough and shortness of breath.   Cardiovascular:  Negative for chest pain and leg swelling.  Gastrointestinal:  Negative for abdominal pain, constipation, diarrhea, nausea and vomiting.  Endocrine: Negative for hot flashes.  Genitourinary:  Negative for difficulty urinating, dysuria, frequency and hematuria.   Musculoskeletal:  Negative for arthralgias, back pain and myalgias.  Skin:  Negative for rash.  Neurological:  Negative for dizziness and headaches.  Hematological:  Negative for adenopathy. Does  not bruise/bleed easily.  Psychiatric/Behavioral:  Negative for depression and sleep disturbance. The patient is not nervous/anxious.      VITALS:  unknown if currently breastfeeding.  Wt Readings from Last 3 Encounters:  07/05/22 99 lb (44.9 kg)  06/28/22 98 lb 0.3 oz (44.5 kg)  06/11/22 96 lb 8 oz (43.8 kg)    There is no height or weight on file to calculate BMI.  Performance status (ECOG): {CHL ONC [id number]  PHYSICAL  EXAM:  Physical Exam Vitals and nursing note reviewed.  Constitutional:      General: She is not in acute distress.    Appearance: Normal appearance.  HENT:     Head: Normocephalic and atraumatic.     Mouth/Throat:     Mouth: Mucous membranes are moist.     Pharynx: Oropharynx is clear. No oropharyngeal exudate or posterior oropharyngeal erythema.  Eyes:     General: No scleral icterus.    Extraocular Movements: Extraocular movements intact.     Conjunctiva/sclera: Conjunctivae normal.     Pupils: Pupils are equal, round, and reactive to light.  Cardiovascular:     Rate and Rhythm: Normal rate and regular rhythm.     Heart sounds: Normal heart sounds. No murmur heard.    No friction rub. No gallop.  Pulmonary:     Effort: Pulmonary effort is normal.     Breath sounds: Normal breath sounds. No wheezing, rhonchi or rales.  Abdominal:     General: There is no distension.     Palpations: Abdomen is soft. There is no hepatomegaly, splenomegaly or mass.     Tenderness: There is no abdominal tenderness.  Musculoskeletal:        General: Normal range of motion.     Cervical back: Normal range of motion and neck supple. No tenderness.     Right lower leg: No edema.     Left lower leg: No edema.  Lymphadenopathy:     Cervical: No cervical adenopathy.     Upper Body:     Right upper body: No supraclavicular or axillary adenopathy.     Left upper body: No supraclavicular or axillary adenopathy.     Lower Body: No right inguinal adenopathy. No left inguinal adenopathy.  Skin:    General: Skin is warm and dry.     Coloration: Skin is not jaundiced.     Findings: No rash.  Neurological:     Mental Status: She is alert and oriented to person, place, and time.     Cranial Nerves: No cranial nerve deficit.  Psychiatric:        Mood and Affect: Mood normal.        Behavior: Behavior normal.        Thought Content: Thought content normal.     LABS:      Latest Ref Rng & Units  06/11/2022   12:00 AM 12/14/2021    6:09 AM 10/30/2019    6:55 PM  CBC  WBC  7.6     5.5  9.1   Hemoglobin 12.0 - 16.0 11.0     11.3  13.6   Hematocrit 36 - 46 35     36.7  41.3   Platelets 150 - 400 K/uL 440     347  283      This result is from an external source.      Latest Ref Rng & Units 06/11/2022   12:00 AM 12/14/2021    6:09 AM 01/18/2018  8:54 PM  CMP  Glucose 70 - 99 mg/dL  105  93   BUN 4 - 21 11     7  7    Creatinine 0.5 - 1.1 0.8     0.92  0.73   Sodium 137 - 147 139     141  136   Potassium 3.5 - 5.1 mEq/L 3.7     3.6  3.7   Chloride 99 - 108 103     107  104   CO2 13 - 22 27     22  23    Calcium 8.7 - 10.7 9.4     9.2  9.4   Total Protein 6.3 - 8.2 g/dL 9      8.1   Total Bilirubin 0.3 - 1.2 mg/dL   0.8   Alkaline Phos 25 - 125 67      85   AST 13 - 35 25      20   ALT 7 - 35 U/L 13      9      This result is from an external source.     No results found for: "CEA1", "CEA" / No results found for: "CEA1", "CEA" No results found for: "PSA1" No results found for: "YNW295" No results found for: "CAN125"  No results found for: "TOTALPROTELP", "ALBUMINELP", "A1GS", "A2GS", "BETS", "BETA2SER", "GAMS", "MSPIKE", "SPEI" Lab Results  Component Value Date   TIBC 514 (H) 06/11/2022   FERRITIN 2 (L) 06/11/2022   IRONPCTSAT 6 (L) 06/11/2022   Lab Results  Component Value Date   LDH 113 06/11/2022    STUDIES:  No results found.    HISTORY:   Past Medical History:  Diagnosis Date   Anemia    Iron deficiency anemia due to chronic blood loss 06/11/2022    Past Surgical History:  Procedure Laterality Date   WISDOM TOOTH EXTRACTION  2017    Family History  Problem Relation Age of Onset   Heart disease Mother    Heart attack Mother     Social History:  reports that she has never smoked. She has never used smokeless tobacco. She reports current alcohol use. She reports that she does not use drugs.The patient is {Blank single:19197::"alone","accompanied by"}  *** today.  Allergies:  Allergies  Allergen Reactions   Magnesium-Containing Compounds     IV MAG    Current Medications: Current Outpatient Medications  Medication Sig Dispense Refill   cyanocobalamin (VITAMIN B12) 500 MCG tablet Take 500 mcg by mouth daily.     escitalopram (LEXAPRO) 10 MG tablet Take 10 mg by mouth daily.     Vitamin D, Ergocalciferol, (DRISDOL) 1.25 MG (50000 UNIT) CAPS capsule Take 50,000 Units by mouth once a week.     No current facility-administered medications for this visit.

## 2022-08-18 NOTE — Assessment & Plan Note (Deleted)
She is status post IV Feraheme.  Her hemoglobin has improved.  Iron studies are pending from today.  We will plan to see her back in 3 months for repeat clinical assessment.

## 2022-08-19 ENCOUNTER — Inpatient Hospital Stay: Payer: Medicaid Other | Attending: Hematology and Oncology

## 2022-08-19 ENCOUNTER — Ambulatory Visit: Payer: Medicaid Other | Admitting: Hematology and Oncology

## 2022-08-19 DIAGNOSIS — D500 Iron deficiency anemia secondary to blood loss (chronic): Secondary | ICD-10-CM

## 2022-10-30 IMAGING — US US OB TRANSVAGINAL
1 series · 15 of 28 positions shown · non-contrast
Comparison: Ultrasound Ob 04/10/2021

CLINICAL DATA: Vaginal bleeding. Gestational age by last menstrual
period 10 weeks and 6 days. Last menstrual period 01/29/2021.
Estimated due date by last menstrual period 11/05/2021. Gestational
age by first ultrasound/assigned gestational age of 6 weeks and 4
days. Gestational age on ultrasound 04/10/21 weeks and 5 days.

EXAM:
OBSTETRIC <14 WK US AND TRANSVAGINAL OB US
TECHNIQUE: Both transabdominal and transvaginal ultrasound examinations were
performed for complete evaluation of the gestation as well as the
maternal uterus, adnexal regions, and pelvic cul-de-sac.
Transvaginal technique was performed to assess early pregnancy.

[Series 1: us ob transvaginal · 15 of 39 slices shown]
[im 1/39]
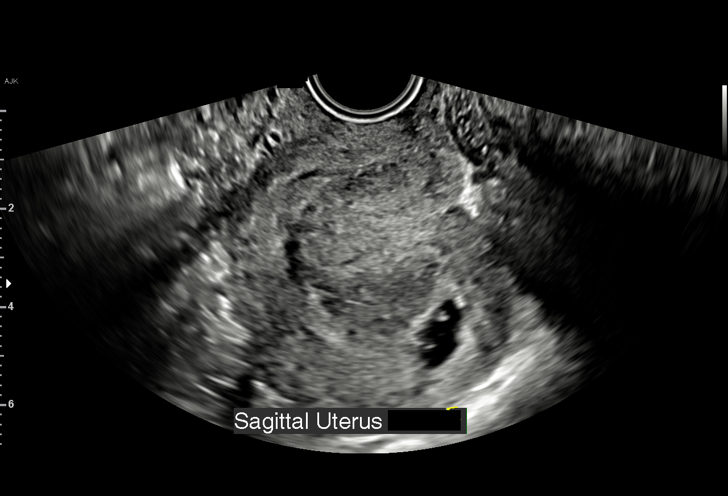
[im 3/39]
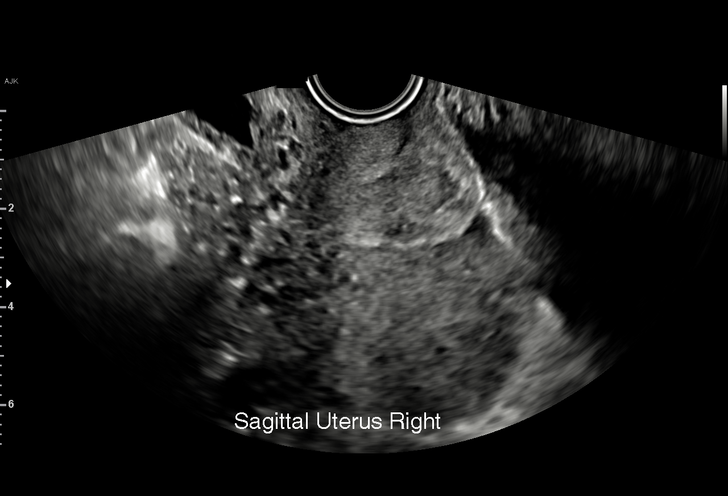
[im 6/39]
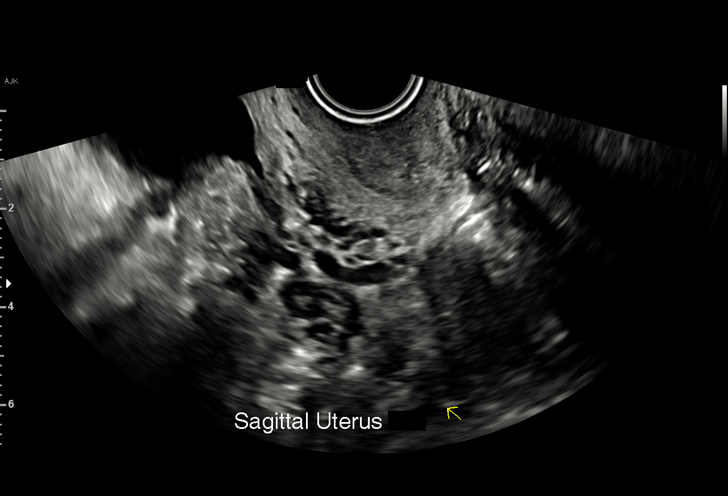
[im 9/39]
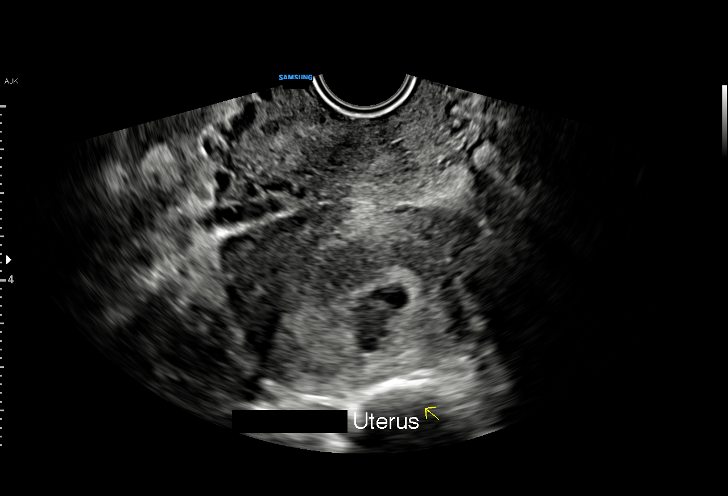
[im 12/39]
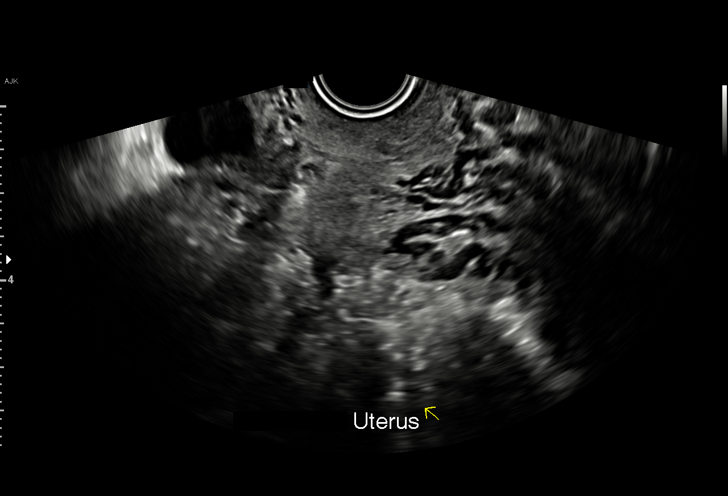
[im 15/39]
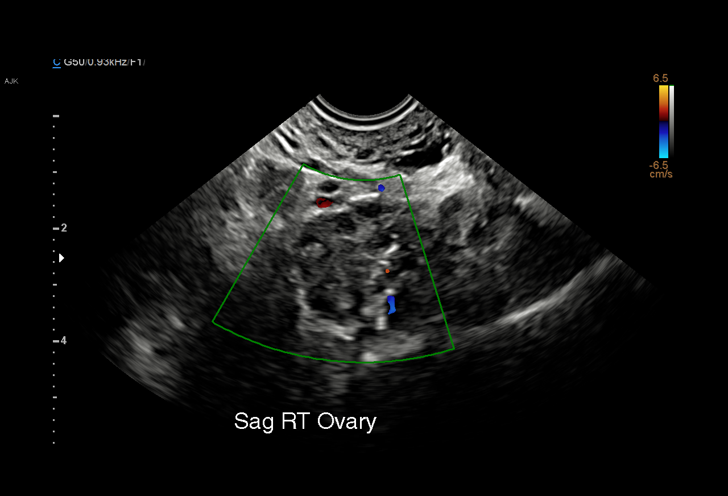
[im 17/39]
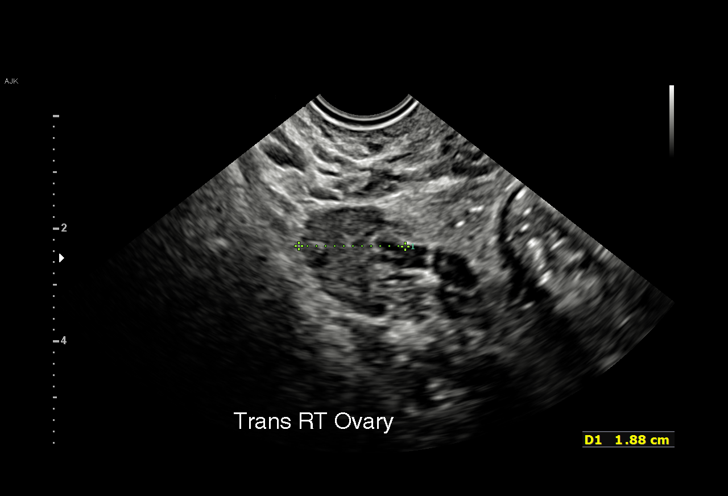
[im 20/39]
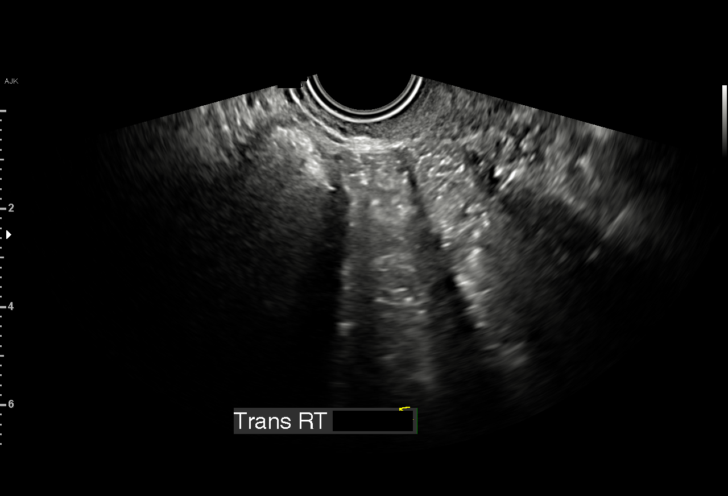
[im 22/39]
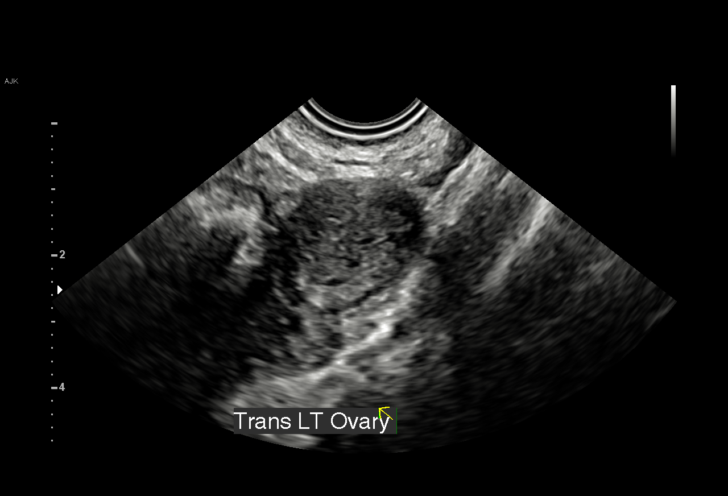
[im 24/39]
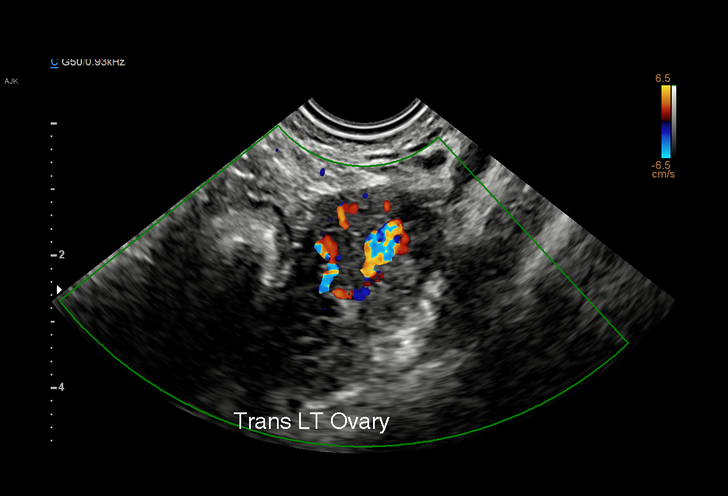
[im 27/39]
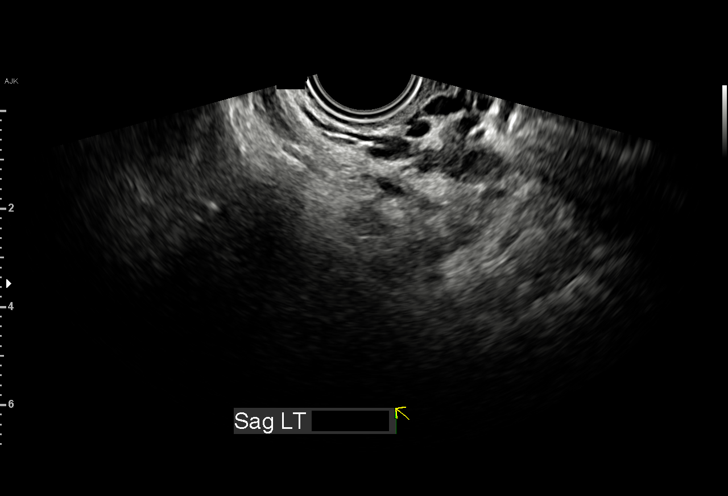
[im 30/39]
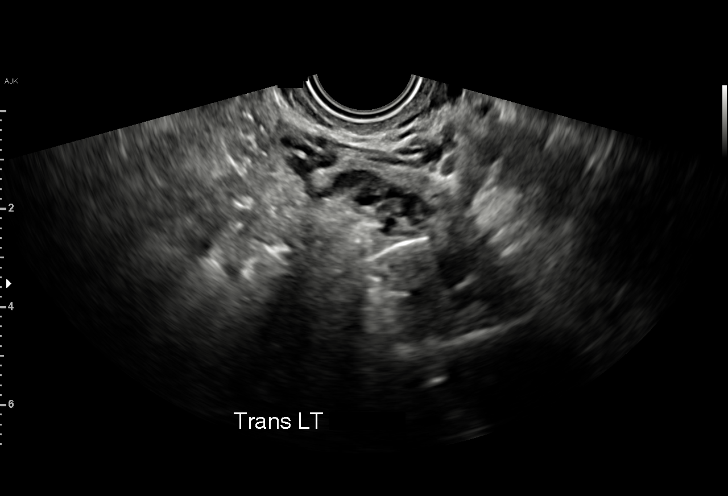
[im 33/39]
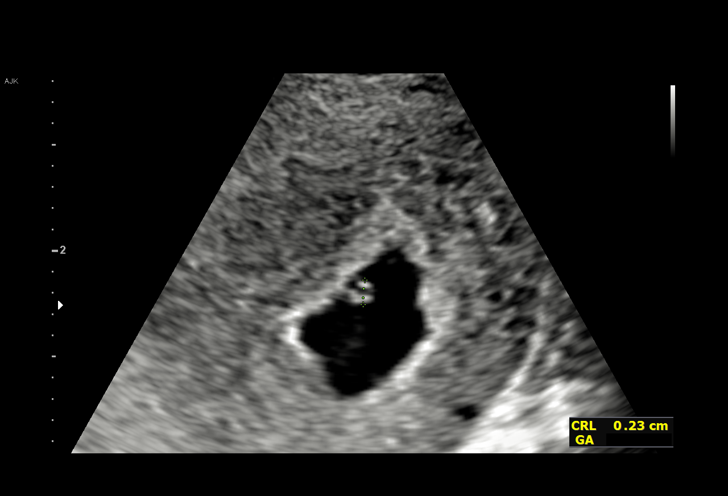
[im 36/39]
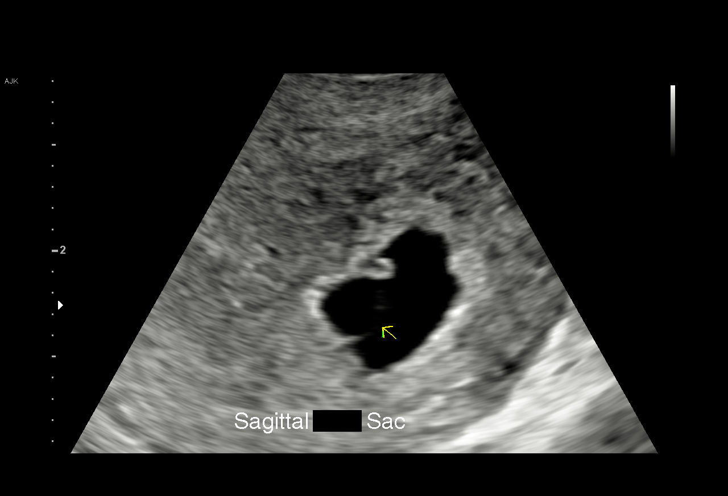
[im 39/39]
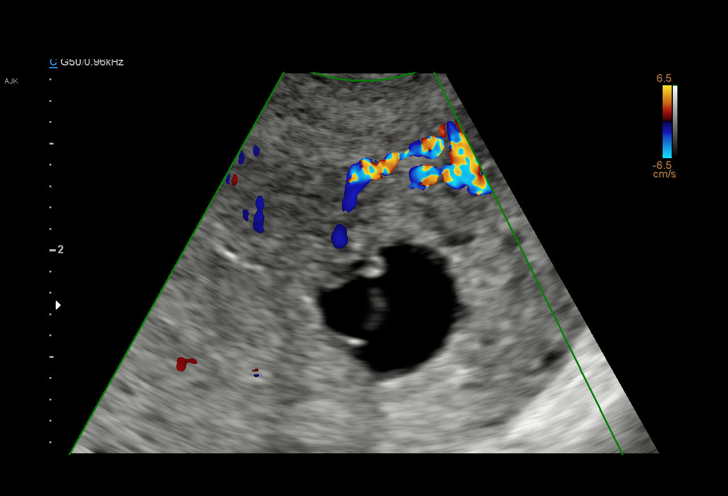

[15 of 28 positions shown; findings below may reference images not displayed]

FINDINGS: Intrauterine gestational sac: Single slightly irregular shaped

Yolk sac:  Visualized.

Embryo:  Visualized.

Cardiac Activity: Not Visualized.

CRL:  2.3 mm   5 w   5 d                  US EDC: 12/11/21

Subchorionic hemorrhage:  None visualized.

Maternal uterus/adnexae: Bilaterally ovaries are unremarkable. The
uterus is unremarkable.

Other: No free pelvic fluid.
IMPRESSION: Single intrauterine pregnancy with no cardiac activity visualized
and no growth since last ultrasound 04/10/2021. Findings meet
definitive criteria for failed pregnancy. This follows SRU consensus
guidelines: Diagnostic Criteria for Nonviable Pregnancy Early in the
First Trimester. N Engl J Med 1790;[DATE].

These results were called by telephone at the time of interpretation
acknowledged these results.

## 2023-03-01 ENCOUNTER — Encounter: Payer: Self-pay | Admitting: Hematology and Oncology

## 2023-03-07 ENCOUNTER — Inpatient Hospital Stay: Payer: Medicaid Other

## 2023-03-07 ENCOUNTER — Inpatient Hospital Stay: Payer: Medicaid Other | Attending: Hematology and Oncology | Admitting: Hematology and Oncology

## 2023-03-07 ENCOUNTER — Encounter: Payer: Self-pay | Admitting: Hematology and Oncology

## 2023-03-07 ENCOUNTER — Telehealth: Payer: Self-pay

## 2023-03-07 VITALS — BP 111/73 | HR 76 | Temp 98.0°F | Resp 18 | Ht 65.0 in | Wt 101.0 lb

## 2023-03-07 DIAGNOSIS — D500 Iron deficiency anemia secondary to blood loss (chronic): Secondary | ICD-10-CM

## 2023-03-07 LAB — CBC AND DIFFERENTIAL
HCT: 40 (ref 36–46)
Hemoglobin: 13.5 (ref 12.0–16.0)
MCV: 88 (ref 81–99)
Neutrophils Absolute: 3.12
Platelets: 284 10*3/uL (ref 150–400)
WBC: 5.2

## 2023-03-07 LAB — IRON AND TIBC
Iron: 26 ug/dL — ABNORMAL LOW (ref 28–170)
Saturation Ratios: 6 % — ABNORMAL LOW (ref 10.4–31.8)
TIBC: 445 ug/dL (ref 250–450)
UIBC: 419 ug/dL

## 2023-03-07 LAB — FERRITIN: Ferritin: 6 ng/mL — ABNORMAL LOW (ref 11–307)

## 2023-03-07 LAB — D-DIMER, QUANTITATIVE: D-Dimer, Quant: <0.27 ug/mL-FEU (ref 0.00–0.50)

## 2023-03-07 LAB — APTT: aPTT: 33 seconds (ref 24–36)

## 2023-03-07 LAB — PROTIME-INR
INR: 1 (ref 0.8–1.2)
Prothrombin Time: 13 seconds (ref 11.4–15.2)

## 2023-03-07 LAB — VITAMIN B12: Vitamin B-12: 184 pg/mL (ref 180–914)

## 2023-03-07 LAB — CBC: RBC: 4.56 (ref 3.87–5.11)

## 2023-03-07 LAB — HCG, SERUM, QUALITATIVE: Preg, Serum: NEGATIVE

## 2023-03-07 LAB — FOLATE: Folate: 7.5 ng/mL (ref >5.9)

## 2023-03-07 LAB — FIBRINOGEN: Fibrinogen: 286 mg/dL (ref 210–475)

## 2023-03-07 NOTE — Progress Notes (Signed)
[facility]  [address] [place],  [place]  [address] [phone]  Clinic Day:  03/07/2023  Referring physician: Lars Mage, NP     CHIEF COMPLAINT:  CC: Iron deficiency anemia  Current Treatment:  IV iron as needed  HISTORY OF PRESENT ILLNESS:  Joanna Oliver is a 22 year old with a history of iron deficiency anemia, felt to be due to heavy menses, who I began seeing in August 2023.  She had a ferritin of 2 and received IV iron in the form of Feraheme in August.  She was also placed on oral B12 due to low normal B12 level.  She has not kept her follow up visits since.  INTERVAL HISTORY:  Joanna is here today for repeat clinical assessment.  She states she has not been regularly taking her B12 supplement.  She reports fatigue not relieved with rest.  She denies pica to ice.  She denies shortness of breath or lightheadedness.  She continues to have heavy menses every 2 to 3 weeks with bleeding for 7 to 10 days 6 of which are heavy.  She did receive Depo Provera x 2 and unfortunately had continuous bleeding after both injections.  She denies other overt form of blood loss.  She denies fevers or chills. She denies pain. Her appetite is good. Her weight has increased 2 pounds over last 8 months .  REVIEW OF SYSTEMS:  Review of Systems  Constitutional:  Positive for fatigue. Negative for appetite change, chills, fever and unexpected weight change.  HENT:   Negative for lump/mass, mouth sores, nosebleeds and sore throat.   Respiratory:  Negative for cough and shortness of breath.   Cardiovascular:  Negative for chest pain and leg swelling.  Gastrointestinal:  Negative for abdominal pain, constipation, diarrhea, nausea and vomiting.  Genitourinary:  Positive for menstrual problem and vaginal bleeding. Negative for difficulty urinating, dysuria, frequency and hematuria.   Musculoskeletal:  Negative for arthralgias, back pain and myalgias.  Skin:  Negative for  rash.  Neurological:  Negative for dizziness, headaches and light-headedness.  Hematological:  Negative for adenopathy. Does not bruise/bleed easily.  Psychiatric/Behavioral:  Negative for depression and sleep disturbance. The patient is not nervous/anxious.      VITALS:  Blood pressure 111/73, pulse 76, temperature 98 F (36.7 C), temperature source Oral, resp. rate 18, height 5\' 5"  (1.651 m), weight 101 lb (45.8 kg), last menstrual period 03/06/2023, SpO2 100 %, unknown if currently breastfeeding.  Wt Readings from Last 3 Encounters:  03/07/23 101 lb (45.8 kg)  07/05/22 99 lb (44.9 kg)  06/28/22 98 lb 0.3 oz (44.5 kg)    Body mass index is 16.81 kg/m.  Performance status (ECOG): 1 - Symptomatic but completely ambulatory  PHYSICAL EXAM:  Physical Exam Vitals and nursing note reviewed.  Constitutional:      General: She is not in acute distress.    Appearance: Normal appearance.  HENT:     Head: Normocephalic and atraumatic.     Mouth/Throat:     Mouth: Mucous membranes are moist.     Pharynx: Oropharynx is clear. No oropharyngeal exudate or posterior oropharyngeal erythema.  Eyes:     General: No scleral icterus.    Extraocular Movements: Extraocular movements intact.     Conjunctiva/sclera: Conjunctivae normal.     Pupils: Pupils are equal, round, and reactive to light.  Cardiovascular:     Rate and Rhythm: Normal rate and regular rhythm.     Heart sounds: Normal  heart sounds. No murmur heard.    No friction rub. No gallop.  Pulmonary:     Effort: Pulmonary effort is normal.     Breath sounds: Normal breath sounds. No wheezing, rhonchi or rales.  Abdominal:     General: There is no distension.     Palpations: Abdomen is soft. There is no hepatomegaly, splenomegaly or mass.     Tenderness: There is no abdominal tenderness.  Musculoskeletal:        General: Normal range of motion.     Cervical back: Normal range of motion and neck supple. No tenderness.     Right  lower leg: No edema.     Left lower leg: No edema.  Lymphadenopathy:     Cervical: No cervical adenopathy.     Upper Body:     Right upper body: No supraclavicular or axillary adenopathy.     Left upper body: No supraclavicular or axillary adenopathy.     Lower Body: No right inguinal adenopathy. No left inguinal adenopathy.  Skin:    General: Skin is warm and dry.     Coloration: Skin is not jaundiced.     Findings: No rash.  Neurological:     Mental Status: She is alert and oriented to person, place, and time.     Cranial Nerves: No cranial nerve deficit.  Psychiatric:        Mood and Affect: Mood normal.        Behavior: Behavior normal.        Thought Content: Thought content normal.     LABS:      Latest Ref Rng & Units 03/07/2023   12:00 AM 06/11/2022   12:00 AM 12/14/2021    6:09 AM  CBC  WBC  5.2     7.6     5.5   Hemoglobin 12.0 - 16.0 13.5     11.0     11.3   Hematocrit 36 - 46 30     35     36.7   Platelets 150 - 400 K/uL 284     440     347      This result is from an external source.      Latest Ref Rng & Units 06/11/2022   12:00 AM 12/14/2021    6:09 AM 01/18/2018    8:54 PM  CMP  Glucose 70 - 99 mg/dL  161  93   BUN 4 - 21 11     7  7    Creatinine 0.5 - 1.1 0.8     0.92  0.73   Sodium 137 - 147 139     141  136   Potassium 3.5 - 5.1 mEq/L 3.7     3.6  3.7   Chloride 99 - 108 103     107  104   CO2 13 - 22 27     22  23    Calcium 8.7 - 10.7 9.4     9.2  9.4   Total Protein 6.3 - 8.2 g/dL 9      8.1   Total Bilirubin 0.3 - 1.2 mg/dL   0.8   Alkaline Phos 25 - 125 67      85   AST 13 - 35 25      20   ALT 7 - 35 U/L 13      9      This result is from an external source.     Lab  Results  Component Value Date   TIBC 445 03/07/2023   TIBC 514 (H) 06/11/2022   FERRITIN 6 (L) 03/07/2023   FERRITIN 2 (L) 06/11/2022   IRONPCTSAT 6 (L) 03/07/2023   IRONPCTSAT 6 (L) 06/11/2022   Lab Results  Component Value Date   LDH 113 06/11/2022    STUDIES:  No  results found.    HISTORY:   Past Medical History:  Diagnosis Date   Anemia    Iron deficiency anemia due to chronic blood loss 06/11/2022    Past Surgical History:  Procedure Laterality Date   WISDOM TOOTH EXTRACTION  2017    Family History  Problem Relation Age of Onset   Heart disease Mother    Heart attack Mother     Social History:  reports that she has never smoked. She has never used smokeless tobacco. She reports current alcohol use. She reports that she does not use drugs.The patient is accompanied by her mother today.  Allergies:  Allergies  Allergen Reactions   Magnesium-Containing Compounds     IV MAG    Current Medications: Current Outpatient Medications  Medication Sig Dispense Refill   cyanocobalamin (VITAMIN B12) 500 MCG tablet Take 500 mcg by mouth daily. (Patient not taking: Reported on 03/07/2023)     escitalopram (LEXAPRO) 10 MG tablet Take 10 mg by mouth daily. (Patient not taking: Reported on 03/07/2023)     Vitamin D, Ergocalciferol, (DRISDOL) 1.25 MG (50000 UNIT) CAPS capsule Take 50,000 Units by mouth once a week. (Patient not taking: Reported on 03/07/2023)     No current facility-administered medications for this visit.     ASSESSMENT & PLAN:   Assessment & Plan: Recurrent iron deficiency and B12 deficiency.  As her hemoglobin and MCV are normal, we will try her on a low-dose of iron with a [place] with iron vitamin and sublingual B12.  She has abnormal menstrual bleeding not improved with Depo-Provera, so we will also evaluate for bleeding disorder..  She will follow-up with Dr. Angelene Giovanni in 1 to 2 weeks to review those results.  I will then plan to see her back in 2 months for repeat clinical assessment.  The patient understands the plans discussed today and is in agreement with them.  She knows to contact our office if she develops concerns prior to her next appointment.   I provided 30 minutes of face-to-face time during this encounter  and > 50% was spent counseling as documented under my assessment and plan.  The patient was staffed and plan formulated with Dr. Angelene Giovanni.   Adah Perl, PA-C  [facility] AT [facility] [address] [place] [address] Dept: [phone] Dept Fax: [phone]   No orders of the defined types were placed in this encounter.

## 2023-03-07 NOTE — Telephone Encounter (Signed)
Patient notified and voiced understanding.

## 2023-03-07 NOTE — Telephone Encounter (Signed)
-----   Message from Adah Perl, PA-C sent at 03/07/2023  3:14 PM EDT ----- Please let her know iron and B12 are low, but Dr. Angelene Giovanni wants her to take oral because her HgB is normal. She did not tolerate ferrous sulfate, so recommend a low dose iron, can do a multivitamin with iron. Also, we want her to take sublingual B12 tab 1000 mcg under the tongue as absorbed better that way. Both are available over the counter. Her pregnancy test was negative. Thanks

## 2023-03-08 LAB — VON WILLEBRAND PANEL
Coagulation Factor VIII: 52 % — ABNORMAL LOW (ref 56–140)
Ristocetin Co-factor, Plasma: 43 % — ABNORMAL LOW (ref 50–200)
Von Willebrand Antigen, Plasma: 57 % (ref 50–200)

## 2023-03-08 LAB — COAG STUDIES INTERP REPORT

## 2023-03-22 ENCOUNTER — Inpatient Hospital Stay: Payer: Medicaid Other

## 2023-03-22 ENCOUNTER — Inpatient Hospital Stay: Payer: Medicaid Other | Attending: Hematology and Oncology | Admitting: Oncology

## 2023-03-22 VITALS — BP 114/73 | HR 85 | Temp 97.7°F | Resp 12 | Ht 65.0 in | Wt 100.3 lb

## 2023-03-22 DIAGNOSIS — D500 Iron deficiency anemia secondary to blood loss (chronic): Secondary | ICD-10-CM | POA: Insufficient documentation

## 2023-03-22 DIAGNOSIS — N920 Excessive and frequent menstruation with regular cycle: Secondary | ICD-10-CM | POA: Insufficient documentation

## 2023-03-22 DIAGNOSIS — D6801 Von willebrand disease, type 1: Secondary | ICD-10-CM | POA: Diagnosis not present

## 2023-03-22 DIAGNOSIS — N938 Other specified abnormal uterine and vaginal bleeding: Secondary | ICD-10-CM

## 2023-03-22 LAB — CBC WITH DIFFERENTIAL/PLATELET
Abs Immature Granulocytes: 0.05 10*3/uL (ref 0.00–0.07)
Basophils Absolute: 0 10*3/uL (ref 0.0–0.1)
Basophils Relative: 1 %
Eosinophils Absolute: 0.1 10*3/uL (ref 0.0–0.5)
Eosinophils Relative: 2 %
HCT: 43.8 % (ref 36.0–46.0)
Hemoglobin: 13.9 g/dL (ref 12.0–15.0)
Immature Granulocytes: 1 %
Lymphocytes Relative: 29 %
Lymphs Abs: 1.5 10*3/uL (ref 0.7–4.0)
MCH: 28.1 pg (ref 26.0–34.0)
MCHC: 31.7 g/dL (ref 30.0–36.0)
MCV: 88.7 fL (ref 80.0–100.0)
Monocytes Absolute: 0.4 10*3/uL (ref 0.1–1.0)
Monocytes Relative: 8 %
Neutro Abs: 3 10*3/uL (ref 1.7–7.7)
Neutrophils Relative %: 59 %
Platelets: 343 10*3/uL (ref 150–400)
RBC: 4.94 MIL/uL (ref 3.87–5.11)
RDW: 12.1 % (ref 11.5–15.5)
WBC: 5.1 10*3/uL (ref 4.0–10.5)
nRBC: 0 % (ref 0.0–0.2)

## 2023-03-22 LAB — HCG, SERUM, QUALITATIVE: Preg, Serum: NEGATIVE

## 2023-03-22 LAB — FERRITIN: Ferritin: 3 ng/mL — ABNORMAL LOW (ref 11–307)

## 2023-03-22 MED ORDER — TRANEXAMIC ACID 650 MG PO TABS: 1300.0000 mg | ORAL_TABLET | Freq: Three times a day (TID) | ORAL | 3 refills | Status: AC

## 2023-03-22 NOTE — Progress Notes (Signed)
[facility] Cancer Initial Visit:  Patient Care Team: Lars Mage, NP as PCP - General (Nurse Practitioner)  CHIEF COMPLAINTS/PURPOSE OF CONSULTATION:   HISTORY OF PRESENTING ILLNESS: Joanna Oliver 22 y.o. female is here because of anemia Patient has no significant past medical history  March 07, 2023 WBC 5.2 hemoglobin 13.5 MCV 88 platelet count 284 Ferritin 6 folate 7.5 B12 184 Factor VIII 52 von Willebrand antigen 57 ristocetin cofactor 52 Fibrinogen 286 D-dimer undetectable INR 1.0 PTT 33  Mar 22 2023:  Scheduled follow up regarding iron deficiency anemia.  Menses occur q 2 to 3 weeks and last 7 to 10 days.  Bleeding is heavy.  Not currently on depot; was on it in the past but stopped because was contemplating having children and also because she had breakthrough bleeding that lasted 3 months.  Having ice pica but not starch/dirt.  No epistaxis but has occasional gingival bleeding if brushes or flosses.  Bruises easily.  Not taking ASA or NSAIDS.  Patient had hemorrhage following delivery of her first child.  Patient's mother and sister also have heavy menses.    Review of Systems - Oncology  MEDICAL HISTORY: Past Medical History:  Diagnosis Date   Anemia    Iron deficiency anemia due to chronic blood loss 06/11/2022    SURGICAL HISTORY: Past Surgical History:  Procedure Laterality Date   WISDOM TOOTH EXTRACTION  2017    SOCIAL HISTORY: Social History   Socioeconomic History   Marital status: Single    Spouse name: Not on file   Number of children: 1   Years of education: 54 + CURRENTLY ENROLLED IN COLLEGE   Highest education level: Not on file  Occupational History   Occupation: NAIL TECHNICIAN  Tobacco Use   Smoking status: Never   Smokeless tobacco: Never  Vaping Use   Vaping Use: Every day  Substance and Sexual Activity   Alcohol use: Yes    Comment: OCCASIONAL   Drug use: Never   Sexual activity: Yes    Birth control/protection: None  Other  Topics Concern   Not on file  Social History Narrative   Not on file   Social Determinants of Health   Financial Resource Strain: Not on file  Food Insecurity: Not on file  Transportation Needs: Not on file  Physical Activity: Not on file  Stress: Not on file  Social Connections: Not on file  Intimate Partner Violence: Not on file    FAMILY HISTORY Family History  Problem Relation Age of Onset   Heart disease Mother    Heart attack Mother     ALLERGIES:  is allergic to magnesium-containing compounds.  MEDICATIONS:  Current Outpatient Medications  Medication Sig Dispense Refill   cyanocobalamin (VITAMIN B12) 500 MCG tablet Take 500 mcg by mouth daily.     ferrous sulfate 325 (65 FE) MG EC tablet Take 325 mg by mouth 3 (three) times daily with meals.     tranexamic acid (LYSTEDA) 650 MG TABS tablet Take 2 tablets (1,300 mg total) by mouth 3 (three) times daily for 5 days. 30 tablet 3   escitalopram (LEXAPRO) 10 MG tablet Take 10 mg by mouth daily. (Patient not taking: Reported on 03/07/2023)     Vitamin D, Ergocalciferol, (DRISDOL) 1.25 MG (50000 UNIT) CAPS capsule Take 50,000 Units by mouth once a week. (Patient not taking: Reported on 03/07/2023)     No current facility-administered medications for this visit.    PHYSICAL EXAMINATION:  ECOG PERFORMANCE  STATUS: 1 - Symptomatic but completely ambulatory   Vitals:   03/22/23 0909  BP: 114/73  Pulse: 85  Resp: 12  Temp: 97.7 F (36.5 C)  SpO2: 99%    Filed Weights   03/22/23 0909  Weight: 100 lb 4.8 oz (45.5 kg)     Physical Exam Vitals and nursing note reviewed.  Constitutional:      General: She is not in acute distress.    Appearance: Normal appearance. She is normal weight. She is not ill-appearing, toxic-appearing or diaphoretic.     Comments: Here with mother  HENT:     Head: Normocephalic and atraumatic.     Right Ear: External ear normal.     Left Ear: External ear normal.     Nose: Nose normal.  No congestion or rhinorrhea.  Eyes:     General: No scleral icterus.    Extraocular Movements: Extraocular movements intact.     Conjunctiva/sclera: Conjunctivae normal.     Pupils: Pupils are equal, round, and reactive to light.  Cardiovascular:     Rate and Rhythm: Normal rate and regular rhythm.     Heart sounds: No murmur heard.    No friction rub. No gallop.  Pulmonary:     Effort: Pulmonary effort is normal. No respiratory distress.     Breath sounds: Normal breath sounds. No stridor.  Abdominal:     General: Bowel sounds are normal.     Palpations: Abdomen is soft.     Tenderness: There is no abdominal tenderness.     Hernia: No hernia is present.  Musculoskeletal:        General: No swelling, tenderness or deformity.     Cervical back: Normal range of motion and neck supple. No rigidity or tenderness.     Right lower leg: No edema.     Left lower leg: No edema.  Lymphadenopathy:     Head:     Right side of head: No submental, submandibular, tonsillar, preauricular, posterior auricular or occipital adenopathy.     Left side of head: No submental, submandibular, tonsillar, preauricular, posterior auricular or occipital adenopathy.     Cervical: No cervical adenopathy.     Right cervical: No superficial, deep or posterior cervical adenopathy.    Left cervical: No superficial, deep or posterior cervical adenopathy.     Upper Body:     Right upper body: No supraclavicular, axillary, pectoral or epitrochlear adenopathy.     Left upper body: No supraclavicular, axillary, pectoral or epitrochlear adenopathy.  Skin:    General: Skin is warm.     Coloration: Skin is not jaundiced or pale.  Neurological:     General: No focal deficit present.     Mental Status: She is alert and oriented to person, place, and time.     Cranial Nerves: No cranial nerve deficit.     Motor: No weakness.  Psychiatric:        Mood and Affect: Mood normal.        Behavior: Behavior normal.         Thought Content: Thought content normal.        Judgment: Judgment normal.     LABORATORY DATA: I have personally reviewed the data as listed:  Appointment on 03/22/2023  Component Date Value Ref Range Status   Preg, Serum 03/22/2023 NEGATIVE  NEGATIVE Final   Comment:        THE SENSITIVITY OF THIS METHODOLOGY IS >10 mIU/mL. Performed at [facility],  [address] [place], [place] [address]    Ferritin 03/22/2023 3 (L)  11 - 307 ng/mL Final   Performed at [facility], [address] [place], [place] [address]  Office Visit on 03/22/2023  Component Date Value Ref Range Status   WBC 03/22/2023 5.1  4.0 - 10.5 K/uL Final   RBC 03/22/2023 4.94  3.87 - 5.11 MIL/uL Final   Hemoglobin 03/22/2023 13.9  12.0 - 15.0 g/dL Final   HCT 09/81/1914 43.8  36.0 - 46.0 % Final   MCV 03/22/2023 88.7  80.0 - 100.0 fL Final   MCH 03/22/2023 28.1  26.0 - 34.0 pg Final   MCHC 03/22/2023 31.7  30.0 - 36.0 g/dL Final   RDW 78/29/5621 12.1  11.5 - 15.5 % Final   Platelets 03/22/2023 343  150 - 400 K/uL Final   nRBC 03/22/2023 0.0  0.0 - 0.2 % Final   Neutrophils Relative % 03/22/2023 59  % Final   Neutro Abs 03/22/2023 3.0  1.7 - 7.7 K/uL Final   Lymphocytes Relative 03/22/2023 29  % Final   Lymphs Abs 03/22/2023 1.5  0.7 - 4.0 K/uL Final   Monocytes Relative 03/22/2023 8  % Final   Monocytes Absolute 03/22/2023 0.4  0.1 - 1.0 K/uL Final   Eosinophils Relative 03/22/2023 2  % Final   Eosinophils Absolute 03/22/2023 0.1  0.0 - 0.5 K/uL Final   Basophils Relative 03/22/2023 1  % Final   Basophils Absolute 03/22/2023 0.0  0.0 - 0.1 K/uL Final   Immature Granulocytes 03/22/2023 1  % Final   Abs Immature Granulocytes 03/22/2023 0.05  0.00 - 0.07 K/uL Final   Performed at [facility], [address] [place], [place] [address]  Office Visit on 03/07/2023  Component Date Value Ref Range Status   Hemoglobin 03/07/2023 13.5  12.0 - 16.0 Final    HCT 03/07/2023 40  36 - 46 Corrected   Neutrophils Absolute 03/07/2023 3.12   Final   Platelets 03/07/2023 284  150 - 400 K/uL Final   WBC 03/07/2023 5.2   Final   MCV 03/07/2023 88  81 - 99 Final   RBC 03/07/2023 4.56  3.87 - 5.11 Final  Clinical Support on 03/07/2023  Component Date Value Ref Range Status   Iron 03/07/2023 26 (L)  28 - 170 ug/dL Final   TIBC 78/46/9629 445  250 - 450 ug/dL Final   Saturation Ratios 03/07/2023 6 (L)  10.4 - 31.8 % Final   UIBC 03/07/2023 419  ug/dL Final   Performed at [facility], [address], [place], [place] [address]   Ferritin 03/07/2023 6 (L)  11 - 307 ng/mL Final   Performed at [facility], [address] [place], [place] [address]   aPTT 03/07/2023 33  24 - 36 seconds Final   Performed at [facility], [address] [place], [place] [address]   Prothrombin Time 03/07/2023 13.0  11.4 - 15.2 seconds Final   INR 03/07/2023 1.0  0.8 - 1.2 Final   Comment: (NOTE) INR goal varies based on device and disease states. Performed at [facility], [address], [place], [place] [address]    Fibrinogen 03/07/2023 286  210 - 475 mg/dL Final   Comment: (NOTE) Fibrinogen results may be underestimated in patients receiving thrombolytic therapy. Performed at [facility], [address], [place], [place] [address]    D-Dimer, Sharene Butters 03/07/2023 <0.27  0.00 - 0.50 ug/mL-FEU Final   Comment: (NOTE) At the manufacturer cut-off value of 0.5 g/mL FEU, this assay has a negative predictive value of 95-100%.This assay is intended for use in conjunction with a clinical pretest probability (PTP) assessment model to exclude pulmonary embolism (PE) and deep venous thrombosis (DVT) in outpatients suspected of PE or DVT. Results should be correlated with clinical presentation. Performed at [facility], [address], [place], [place]  [address]    Coagulation Factor VIII 03/07/2023 52 (L)  56 - 140 % Final   Comment: (NOTE) FVIII levels vary with ABO blood group with the lowest levels occurring in patients with type O.  The lower limit of the reference interval in persons with type O is approximately 40%.    Ristocetin Co-factor, Plasma 03/07/2023 43 (L)  50 - 200 % Final   Comment: (NOTE) VWF levels vary with ABO blood group with the lowest levels occurring in patients with type O.  The lower limit of the reference interval in persons with type O is approximately 40%.  In addition, the VWF:RCo assay demonstrates significant variability and slightly low values are commonly seen due to preanalytical and analytical variables. VWF:RCo may be spuriously low in individuals with certain polymorphisms in the VWF gene (e.g. [id number]) that alter the binding of ristocetin to VWF.  These polymorphisms have been reported in 17% of whites and 63% of African Americans without a history of a bleeding disorder (Blood.  2010; 116(2):280-286). Performed At: [facility] [address] [place], [place] [id number] Jolene Schimke MD [id number]    Von Willebrand Antigen, Plasma 03/07/2023 57  50 - 200 % Final   Comment: (NOTE) This test was developed and its performance characteristics determined by [facility]. It has not been cleared or approved by the Food and Drug Administration.    Vitamin B-12 03/07/2023 184  180 - 914 pg/mL Final   Comment: (NOTE) This assay is not validated for testing neonatal or myeloproliferative syndrome specimens for Vitamin B12 levels. Performed at [facility], [address], [place], [place] [address]    Folate 03/07/2023 7.5  >5.9 ng/mL Final   Performed at [facility], [address] [place], [place] [address]   Preg, Serum 03/07/2023 NEGATIVE  NEGATIVE Final   Comment:        THE SENSITIVITY OF THIS METHODOLOGY IS >10 mIU/mL. Performed at [facility], [address] [place], [place] [address]    Interpretation 03/07/2023 Note   Final   Comment: (NOTE) ------------------------------- COAGULATION: VON WILLEBRAND FACTOR ASSESSMENT CURRENT RESULTS ASSESSMENT The VWF:Ag is normal. The VWF:RCo is slightly decreased. The FVIII is slightly decreased. VON WILLEBRAND FACTOR ASSESSMENT CURRENT RESULTS INTERPRETATION - Although the results are abnormal, the panel does not meet the laboratory criteria for VWD. The 2008 NHLBI VWD guideline (summary in Am J Hematol. 2009; 84(6):366-370) suggests that a diagnosis of VWD (excluding type 2N) be reserved for patients with VWF levels that fall below 30%. Results in the range of 30 to 50% may be associated with an increased risk for bleeding, and do not preclude a VWD diagnosis if supporting clinical history is present, nor preclude therapy to elevate VWF levels with a clinical bleeding risk. This evaluation does not distinguish congenital from acquired von Willebrand syndrome (e.g. secondary to hypothyroidism, lymphoproliferative disorders, certain cardiac conditions associa  ted with increased shear stress). VON WILLEBRAND FACTOR ASSESSMENT - VWF and FVIII levels vary with ABO blood group with the lowest levels occurring in patients with type O. The lower limit of the reference interval in persons with type O is approximately 40%. The VWF:RCo assay demonstrates significant variability and falsely low values are commonly seen due to preanalytical and analytical variables. VWF:RCo may be spuriously low in individuals with polymorphisms (reported in 17% of whites and 63% of African Americans) in the VWF gene (clinical testing not available) that alter the binding of ristocetin to VWF. (Blood. 2010; 116(2):280-286). VWF activity can also be measured using a collagen binding assay, which is labeled for research use only, but does not show  interference due to these polymorphisms. FVIII (but not VWF) is a labile factor and levels may decrease if a sample is left at room temperature for prolonged periods of time, resulting in a spuriously decrea                          sed FVIII result. The presence of a lupus anticoagulant may also cause a spurious decrease in FVIII. Results may be falsely elevated and possibly falsely normal as VWF and FVIII may increase in pregnancy, in samples drawn from patients (particularly children) who are visibly stressed at the time of phlebotomy, as acute phase reactants, or in response to certain drug therapies such as desmopressin. VON WILLEBRAND FACTOR ASSESSMENT FURTHER CONSIDERATIONS - Consider repeat analysis on a new plasma sample to re-evaluate this pattern of results. VON WILLEBRAND FACTOR ASSESSMENT DEFINITIONS - VWD - von Willebrand disease; VWF - von Willebrand factor; VWF:Ag - VWF antigen; VWF:RCo - VWF ristocetin cofactor activity; FVIII - factor VIII activity. MEDICAL DIRECTOR: For questions regarding panel interpretation, please contact Lebron Conners, M.D. at [facility] at [phone]. ------------------------------- DISCLAIMER These assessments and interpretatio                          ns are provided as a convenience in support of the physician-patient relationship and are not intended to replace the physician's clinical judgment. They are derived from national guidelines in addition to other evidence and expert opinion. The clinician should consider this information within the context of clinical opinion and the individual patient. SEE GUIDANCE FOR VON WILLEBRAND FACTOR ASSESSMENT: (1) The National Heart, Lung and Blood Institute. The Diagnosis, Evaluation and Management of von Willebrand Disease. Lafe Garin, MD: [employer] Publication [phone]. 2007. Available at http://kemp.com/. (2) Annie Sable et al.  Othella Boyer J Hematol. 2009; 84(6):366-370. (3) Laffan M et al. Haemophilia. 2004;10(3):199-217. (4) Pasi KJ et al. Haemophilia. 2004; 10(3):218-231. Performed At: [facility] [address] [place], [place] [id number] Blanchie Serve MD HY:[id number]     RADIOGRAPHIC STUDIES: I have personally reviewed the radiological images as listed and agree with the findings in the report  No results found.  ASSESSMENT/PLAN 22 y.o. female is here because of anemia.  Medical history notable for menorrhagia and postpartum hemorrhage  Anemia:  Mainly due to iron deficiency anemia owing to dysfunctional uterine bleeding/ exacerbated by high demand owing from prior pregnancy and postpartum hemorrhage.  Patient also has family history of thalassemia.  Has required IV iron in the past and Hgb corrected with this Mar 22 2023- Will check CBC with diff, ferritin.  Arranging for IV iron due to sx of IDA which includes fatigue and pica  Dysfunctional uterine bleeding:  This is characterized by menses that are prolonged, irregular as well as by heavy bleeding with passage of clots.   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  To start Tranexamic acid 1.3 grams tid x 5 days to start with next meses  Von Willebrand Disease, Type I:   Mar 22 2023- Patient does not meet the strict laboratory criteria but has clinical criteria which are consistent 1) menorrhagia 2) postpartum hemorrhage 3) gingival bleeding 4) easy bruising.  Of note also has family history of heavy menstrual bleeding (mother, sister)     Cancer Staging  No matching staging information was found for the patient.   No problem-specific Assessment & Plan notes found for this  encounter.    Orders Placed This Encounter  Procedures   Von Willebrand panel    Standing Status:   Future    Number of Occurrences:   1    Standing Expiration Date:   03/21/2024   Von Willebrand Factor Multimer    Standing Status:   Future    Number of Occurrences:   1    Standing Expiration Date:   03/21/2024   Ferritin    Standing Status:   Future    Number of Occurrences:   1    Standing Expiration Date:   03/21/2024   HCG, serum qualitative    Standing Status:   Future    Number of Occurrences:   1    Standing Expiration Date:   03/21/2024   CBC with Differential/Platelet   Ambulatory referral to Gynecology    Referral Priority:   Routine    Referral Type:   Consultation    Referral Reason:   Specialty Services Required    Requested Specialty:   Gynecology    Number of Visits Requested:   1    40  minutes was spent in patient care.  This included time spent preparing to see the patient (e.g., review of tests), obtaining and/or reviewing separately obtained history, counseling and educating the patient/family/caregiver, ordering medications, tests, or procedures; documenting clinical information in the electronic or other health record, independently interpreting results and communicating results to the patient/family/caregiver as well as coordination of care.       All questions were answered. The patient knows to call the clinic with any problems, questions or concerns.  This note was electronically signed.    Loni Muse, MD  03/22/2023 2:54 PM

## 2023-03-23 ENCOUNTER — Telehealth: Payer: Self-pay | Admitting: Oncology

## 2023-03-23 NOTE — Telephone Encounter (Signed)
Contacted pt to schedule appts. Unable to reach via phone, voicemail was left.   Arville Care, CMA sent to [employer] Scheduling 4 week follow up with labs Iron infusion

## 2023-03-25 LAB — VON WILLEBRAND PANEL
Coagulation Factor VIII: 63 % (ref 56–140)
Ristocetin Co-factor, Plasma: 37 % — ABNORMAL LOW (ref 50–200)
Von Willebrand Antigen, Plasma: 56 % (ref 50–200)

## 2023-03-25 LAB — COAG STUDIES INTERP REPORT

## 2023-03-28 MED FILL — Ferumoxytol Inj 510 MG/17ML (30 MG/ML) (Elemental Fe): INTRAVENOUS | Qty: 17 | Status: AC

## 2023-03-29 ENCOUNTER — Inpatient Hospital Stay: Payer: Medicaid Other

## 2023-03-29 VITALS — BP 112/68 | HR 90 | Temp 98.0°F | Resp 16

## 2023-03-29 DIAGNOSIS — D500 Iron deficiency anemia secondary to blood loss (chronic): Secondary | ICD-10-CM | POA: Diagnosis not present

## 2023-03-29 MED ORDER — SODIUM CHLORIDE 0.9 % IV SOLN: Freq: Once | INTRAVENOUS | Status: AC

## 2023-03-29 MED ORDER — ACETAMINOPHEN 325 MG PO TABS
650.0000 mg | ORAL_TABLET | Freq: Once | ORAL | Status: AC
  Administered 2023-03-29: 650 mg via ORAL
  Filled 2023-03-29: qty 2

## 2023-03-29 MED ORDER — LORATADINE 10 MG PO TABS
10.0000 mg | ORAL_TABLET | Freq: Once | ORAL | Status: AC
  Administered 2023-03-29: 10 mg via ORAL
  Filled 2023-03-29: qty 1

## 2023-03-29 MED ORDER — SODIUM CHLORIDE 0.9 % IV SOLN
510.0000 mg | Freq: Once | INTRAVENOUS | Status: AC
  Administered 2023-03-29: 510 mg via INTRAVENOUS
  Filled 2023-03-29: qty 510

## 2023-03-29 NOTE — Patient Instructions (Signed)

## 2023-04-05 MED FILL — Ferumoxytol Inj 510 MG/17ML (30 MG/ML) (Elemental Fe): INTRAVENOUS | Qty: 17 | Status: AC

## 2023-04-06 ENCOUNTER — Inpatient Hospital Stay: Payer: Medicaid Other

## 2023-04-06 VITALS — BP 118/70 | HR 84 | Temp 98.6°F | Resp 18 | Ht 65.0 in | Wt 101.5 lb

## 2023-04-06 DIAGNOSIS — D500 Iron deficiency anemia secondary to blood loss (chronic): Secondary | ICD-10-CM

## 2023-04-06 MED ORDER — SODIUM CHLORIDE 0.9 % IV SOLN: Freq: Once | INTRAVENOUS | Status: AC

## 2023-04-06 MED ORDER — LORATADINE 10 MG PO TABS
10.0000 mg | ORAL_TABLET | Freq: Once | ORAL | Status: AC
  Administered 2023-04-06: 10 mg via ORAL
  Filled 2023-04-06: qty 1

## 2023-04-06 MED ORDER — SODIUM CHLORIDE 0.9 % IV SOLN
510.0000 mg | Freq: Once | INTRAVENOUS | Status: AC
  Administered 2023-04-06: 510 mg via INTRAVENOUS
  Filled 2023-04-06: qty 510

## 2023-04-06 MED ORDER — ACETAMINOPHEN 325 MG PO TABS
650.0000 mg | ORAL_TABLET | Freq: Once | ORAL | Status: AC
  Administered 2023-04-06: 650 mg via ORAL
  Filled 2023-04-06: qty 2

## 2023-04-06 NOTE — Patient Instructions (Signed)

## 2023-04-08 LAB — VON WILLEBRAND FACTOR MULTIMER

## 2023-04-29 ENCOUNTER — Other Ambulatory Visit: Payer: Self-pay | Admitting: Hematology and Oncology

## 2023-04-29 DIAGNOSIS — D500 Iron deficiency anemia secondary to blood loss (chronic): Secondary | ICD-10-CM

## 2023-05-05 NOTE — Progress Notes (Deleted)
[facility] Cancer Initial Visit:  Patient Care Team: Lars Mage, NP as PCP - General (Nurse Practitioner)  CHIEF COMPLAINTS/PURPOSE OF CONSULTATION:   HISTORY OF PRESENTING ILLNESS: Joanna Oliver 22 y.o. female is here because of anemia Patient has no significant past medical history  March 07, 2023 WBC 5.2 hemoglobin 13.5 MCV 88 platelet count 284 Ferritin 6 folate 7.5 B12 184 Factor VIII 52 von Willebrand antigen 57 ristocetin cofactor 52 Fibrinogen 286 D-dimer undetectable INR 1.0 PTT 33  Mar 22 2023:  Scheduled follow up regarding iron deficiency anemia.  Menses occur q 2 to 3 weeks and last 7 to 10 days.  Bleeding is heavy.  Not currently on depot; was on it in the past but stopped because was contemplating having children and also because she had breakthrough bleeding that lasted 3 months.  Having ice pica but not starch/dirt.  No epistaxis but has occasional gingival bleeding if brushes or flosses.  Bruises easily.  Not taking ASA or NSAIDS.  Patient had hemorrhage following delivery of her first child.  Patient's mother and sister also have heavy menses.    Mar 29, 2023: Feraheme weekly for 2 doses  May 06, 2023: Scheduled follow-up regarding iron deficiency anemia.   Review of Systems - Oncology  MEDICAL HISTORY: Past Medical History:  Diagnosis Date   Anemia    Iron deficiency anemia due to chronic blood loss 06/11/2022    SURGICAL HISTORY: Past Surgical History:  Procedure Laterality Date   WISDOM TOOTH EXTRACTION  2017    SOCIAL HISTORY: Social History   Socioeconomic History   Marital status: Single    Spouse name: Not on file   Number of children: 1   Years of education: 77 + CURRENTLY ENROLLED IN COLLEGE   Highest education level: Not on file  Occupational History   Occupation: NAIL TECHNICIAN  Tobacco Use   Smoking status: Never   Smokeless tobacco: Never  Vaping Use   Vaping Use: Every day  Substance and Sexual Activity   Alcohol  use: Yes    Comment: OCCASIONAL   Drug use: Never   Sexual activity: Yes    Birth control/protection: None  Other Topics Concern   Not on file  Social History Narrative   Not on file   Social Determinants of Health   Financial Resource Strain: Not on file  Food Insecurity: Not on file  Transportation Needs: Not on file  Physical Activity: Not on file  Stress: Not on file  Social Connections: Not on file  Intimate Partner Violence: Not on file    FAMILY HISTORY Family History  Problem Relation Age of Onset   Heart disease Mother    Heart attack Mother     ALLERGIES:  is allergic to magnesium-containing compounds.  MEDICATIONS:  Current Outpatient Medications  Medication Sig Dispense Refill   cyanocobalamin (VITAMIN B12) 500 MCG tablet Take 500 mcg by mouth daily.     escitalopram (LEXAPRO) 10 MG tablet Take 10 mg by mouth daily. (Patient not taking: Reported on 03/07/2023)     ferrous sulfate 325 (65 FE) MG EC tablet Take 325 mg by mouth 3 (three) times daily with meals.     Vitamin D, Ergocalciferol, (DRISDOL) 1.25 MG (50000 UNIT) CAPS capsule Take 50,000 Units by mouth once a week. (Patient not taking: Reported on 03/07/2023)     No current facility-administered medications for this visit.    PHYSICAL EXAMINATION:  ECOG PERFORMANCE STATUS: 1 - Symptomatic but completely ambulatory  There were no vitals filed for this visit.   There were no vitals filed for this visit.    Physical Exam Vitals and nursing note reviewed.  Constitutional:      General: She is not in acute distress.    Appearance: Normal appearance. She is normal weight. She is not ill-appearing, toxic-appearing or diaphoretic.     Comments: Here with mother  HENT:     Head: Normocephalic and atraumatic.     Right Ear: External ear normal.     Left Ear: External ear normal.     Nose: Nose normal. No congestion or rhinorrhea.  Eyes:     General: No scleral icterus.    Extraocular Movements:  Extraocular movements intact.     Conjunctiva/sclera: Conjunctivae normal.     Pupils: Pupils are equal, round, and reactive to light.  Cardiovascular:     Rate and Rhythm: Normal rate and regular rhythm.     Heart sounds: No murmur heard.    No friction rub. No gallop.  Pulmonary:     Effort: Pulmonary effort is normal. No respiratory distress.     Breath sounds: Normal breath sounds. No stridor.  Abdominal:     General: Bowel sounds are normal.     Palpations: Abdomen is soft.     Tenderness: There is no abdominal tenderness.     Hernia: No hernia is present.  Musculoskeletal:        General: No swelling, tenderness or deformity.     Cervical back: Normal range of motion and neck supple. No rigidity or tenderness.     Right lower leg: No edema.     Left lower leg: No edema.  Lymphadenopathy:     Head:     Right side of head: No submental, submandibular, tonsillar, preauricular, posterior auricular or occipital adenopathy.     Left side of head: No submental, submandibular, tonsillar, preauricular, posterior auricular or occipital adenopathy.     Cervical: No cervical adenopathy.     Right cervical: No superficial, deep or posterior cervical adenopathy.    Left cervical: No superficial, deep or posterior cervical adenopathy.     Upper Body:     Right upper body: No supraclavicular, axillary, pectoral or epitrochlear adenopathy.     Left upper body: No supraclavicular, axillary, pectoral or epitrochlear adenopathy.  Skin:    General: Skin is warm.     Coloration: Skin is not jaundiced or pale.  Neurological:     General: No focal deficit present.     Mental Status: She is alert and oriented to person, place, and time.     Cranial Nerves: No cranial nerve deficit.     Motor: No weakness.  Psychiatric:        Mood and Affect: Mood normal.        Behavior: Behavior normal.        Thought Content: Thought content normal.        Judgment: Judgment normal.     LABORATORY  DATA: I have personally reviewed the data as listed:  No visits with results within 1 Month(s) from this visit.  Latest known visit with results is:  Clinical Support on 03/22/2023  Component Date Value Ref Range Status   Preg, Serum 03/22/2023 NEGATIVE  NEGATIVE Final   Comment:        THE SENSITIVITY OF THIS METHODOLOGY IS >10 mIU/mL. Performed at [facility], [address], [place], [place] [address]    Coagulation Factor VIII  03/22/2023 63  56 - 140 % Final   Ristocetin Co-factor, Plasma 03/22/2023 37 (L)  50 - 200 % Final   Comment: (NOTE) The VWF:RCo assay demonstrates significant variability and slightly low values are commonly seen due to preanalytical and analytical variables.  VWF:RCo may be spuriously low in individuals with certain polymorphisms in the VWF gene (e.g. [id number]) that alter the binding of ristocetin to VWF.  These polymorphisms have been reported in 17% of whites and 63% of African Americans without a history of a bleeding disorder (Blood.  2010; 116(2):280-286). Performed At: [facility] [address] [place], [place] [id number] Jolene Schimke MD HQ:[id number]    Von Willebrand Antigen, Plasma 03/22/2023 56  50 - 200 % Final   Comment: (NOTE) This test was developed and its performance characteristics determined by [facility]. It has not been cleared or approved by the Food and Drug Administration.    Ferritin 03/22/2023 3 (L)  11 - 307 ng/mL Final   Performed at [facility], [address] [place], [place] [address]   Von Willebrand Multimers 03/22/2023 Comment   Final   Comment: (NOTE) VWF multimer analysis demonstrates a normal pattern and distribution of bands. This is the pattern of multimers that occurs in normal individuals as well as in those with type 1 von Willebrand disease (VWD), type 25M and type 2N VWD. Some acquired von Willebrand syndrome cases may yield a normal pattern as well. No  additional results available for further interpretation. This test was developed and its performance characteristics determined by [facility].  It has not been cleared or approved by the Food and Drug Administration. Performed At: [facility] [address] [place], [place] [id number] Ethel Rana MD IH:[id number]    Interpretation 03/22/2023 Note   Final   Comment: (NOTE) ------------------------------- COAGULATION: VON WILLEBRAND FACTOR ASSESSMENT CURRENT RESULTS ASSESSMENT The VWF:Ag is normal. The VWF:RCo is slightly decreased. The FVIII is normal. VON WILLEBRAND FACTOR ASSESSMENT CURRENT RESULTS INTERPRETATION - Although the results are abnormal, the panel does not meet the laboratory criteria for VWD. The 2008 NHLBI VWD guideline (summary in Am J Hematol. 2009; 84(6):366-370) suggests that a diagnosis of VWD (excluding type 2N) be reserved for patients with VWF levels that fall below 30%. Results in the range of 30 to 50% may be associated with an increased risk for bleeding, and do not preclude a VWD diagnosis if supporting clinical history is present, nor preclude therapy to elevate VWF levels with a clinical bleeding risk. VWF:RCo/VWF:Ag ratio is less than 0.7 and for this reason type 2 VWD, including type 2A, 2B, and 25M, or platelet-type VWD cannot be excluded. Distinction between type 1 and type 2 VWD should not be                           made on VWF:RCo/VWF:Ag ratios only as there may be significant overlap between type 1 and type 2 VWD especially when ratios are between 0.5 and 0.7. This evaluation does not distinguish congenital from acquired von Willebrand syndrome (e.g. secondary to hypothyroidism, lymphoproliferative disorders, certain cardiac conditions associated with increased shear stress). VON WILLEBRAND FACTOR ASSESSMENT - VWF and FVIII levels vary with ABO blood group with the lowest levels occurring in patients with type O. The  lower limit of the reference interval in persons with type O is approximately 40%. The VWF:RCo assay demonstrates significant variability and falsely low values are commonly seen due to  preanalytical and analytical variables. VWF:RCo may be spuriously low in individuals with polymorphisms (reported in 17% of whites and 63% of African Americans) in the VWF gene (clinical testing not available) that alter the binding of ristocetin to VWF. (Blood. 2010; 116(2):280-286                          ). VWF activity can also be measured using a collagen binding assay, which is labeled for research use only, but does not show interference due to these polymorphisms. Results may be falsely elevated and possibly falsely normal as VWF and FVIII may increase in pregnancy, in samples drawn from patients (particularly children) who are visibly stressed at the time of phlebotomy, as acute phase reactants, or in response to certain drug therapies such as desmopressin. Repeat testing may be necessary before excluding a diagnosis of VWD especially if the clinical suspicion is high for an underlying bleeding disorder. The setting for phlebotomy should be as calm as possible and patients should be encouraged to sit quietly prior to the blood draw. VON WILLEBRAND FACTOR ASSESSMENT CUMULATIVE RESULTS SUMMARY - Interpretation is based on current and most recent VWF Assessment profiles performed at [facility]. While the results of VWF testing are abnormal on two consecutive occasions                          , results do not meet the criteria for diagnosis based on the 2008 NHLBI VWD guideline (summary in Am J Hematol. 2009; 36:644-034). Results in the range of 30 to 50% may be associated with an increased risk for bleeding, and do not preclude a VWD diagnosis if supporting clinical history is present, nor preclude therapy to elevate VWF levels with a clinical bleeding risk. VON WILLEBRAND FACTOR ASSESSMENT  DEFINITIONS - VWD - von Willebrand disease; VWF - von Willebrand factor; VWF:Ag - VWF antigen; VWF:RCo - VWF ristocetin cofactor activity; FVIII - factor VIII activity. MEDICAL DIRECTOR: For questions regarding panel interpretation, please contact Lebron Conners, M.D. at [facility] at [phone]. ------------------------------- DISCLAIMER These assessments and interpretations are provided as a convenience in support of the physician-patient relationship and are not intended to replace the physician's clinical judgment. They are derived from nat                          ional guidelines in addition to other evidence and expert opinion. The clinician should consider this information within the context of clinical opinion and the individual patient. SEE GUIDANCE FOR VON WILLEBRAND FACTOR ASSESSMENT: (1) The National Heart, Lung and Blood Institute. The Diagnosis, Evaluation and Management of von Willebrand Disease. Lafe Garin, MD: [employer] Publication [phone]. 2007. Available at http://kemp.com/. (2) Annie Sable et al. Othella Boyer J Hematol. 2009; 84(6):366-370. (3) Laffan M et al. Haemophilia. 2004;10(3):199-217. (4) Pasi KJ et al. Haemophilia. 2004; 10(3):218-231. Performed At: [facility] [address] [place], [place] [id number] Blanchie Serve MD YT:[id number]     RADIOGRAPHIC STUDIES: I have personally reviewed the radiological images as listed and agree with the findings in the report  No results found.  ASSESSMENT/PLAN 22 y.o. female is here because of anemia.  Medical history notable for menorrhagia and postpartum hemorrhage  Anemia:  Mainly due to iron deficiency anemia owing to dysfunctional uterine bleeding/ exacerbated by high demand owing from prior pregnancy and postpartum hemorrhage.  Patient also has family history of  thalassemia.  Has required IV iron in the past and Hgb corrected with  this Mar 22 2023- Will check CBC with diff, ferritin.  Arranging for IV iron due to sx of IDA which includes fatigue and pica Mar 29 2023- Feraheme weekly x2     Dysfunctional uterine bleeding:  This is characterized by menses that are prolonged, irregular as well as by heavy bleeding with passage of clots.   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  To start tranexamic acid 1.3 grams tid x 5 days to start with next menses  Von Willebrand Disease, Type I:   Mar 22 2023- Patient does not meet the strict laboratory criteria but has clinical criteria which are consistent 1) menorrhagia 2) postpartum hemorrhage 3) gingival bleeding 4) easy bruising.  Of note also has family history of heavy menstrual bleeding (mother, sister)     Cancer Staging  No matching staging information was found for the patient.   No problem-specific Assessment & Plan notes found for this encounter.    No orders of the defined types were placed in this encounter.   40  minutes was spent in patient care.  This included time spent preparing to see the patient (e.g., review of tests), obtaining and/or reviewing separately obtained history, counseling and educating the patient/family/caregiver, ordering medications, tests, or procedures; documenting clinical information in the electronic or other health record, independently interpreting results and communicating results to the patient/family/caregiver as well as coordination of care.       All questions were answered. The patient knows to call the clinic with any problems, questions or concerns.  This note was electronically signed.    Adah Perl, PA-C  05/05/2023 5:12 PM

## 2023-05-06 ENCOUNTER — Inpatient Hospital Stay: Payer: Medicaid Other | Admitting: Hematology and Oncology

## 2023-05-06 ENCOUNTER — Inpatient Hospital Stay: Payer: Medicaid Other

## 2023-05-16 NOTE — Progress Notes (Signed)
[facility] Cancer Follow up Visit:  Patient Care Team: Lars Mage, NP as PCP - General (Nurse Practitioner)  CHIEF COMPLAINTS/PURPOSE OF CONSULTATION:   HISTORY OF PRESENTING ILLNESS: Joanna Oliver 22 y.o. female is here because of anemia Patient has no significant past medical history  March 07, 2023 WBC 5.2 hemoglobin 13.5 MCV 88 platelet count 284 Ferritin 6 folate 7.5 B12 184 Factor VIII 52 von Willebrand antigen 57 ristocetin cofactor 52 Fibrinogen 286 D-dimer undetectable INR 1.0 PTT 33  Mar 22 2023:    Menses occur q 2 to 3 weeks and last 7 to 10 days.  Bleeding is heavy.  Not currently on depot; was on it in the past but stopped because was contemplating having children and also because she had breakthrough bleeding that lasted 3 months.  Having ice pica but not starch/dirt.  No epistaxis but has occasional gingival bleeding if brushes or flosses.  Bruises easily.  Not taking ASA or NSAIDS.  Patient had hemorrhage following delivery of her first child.  Patient's mother and sister also have heavy menses.    Ferritin 3 Hgb 13.9  Mar 29 2023:  Feraheme 510 mg Apr 06 2023:   Feraheme 510 mg  May 17 2023:  Scheduled follow up regarding iron deficiency anemia.  Restarted Depot Provera since last visit with resolution of menstrual bleeding.  Has not used Tranexamic acid.  Taking PNV every other day.  Still a bit fatigued.  No longer having ice pica (used to be bad)  Works as nail-tech Hgb 13.6 Ferritin 184   Review of Systems - Oncology  MEDICAL HISTORY: Past Medical History:  Diagnosis Date   Anemia    Iron deficiency anemia due to chronic blood loss 06/11/2022    SURGICAL HISTORY: Past Surgical History:  Procedure Laterality Date   WISDOM TOOTH EXTRACTION  2017    SOCIAL HISTORY: Social History   Socioeconomic History   Marital status: Single    Spouse name: Not on file   Number of children: 1   Years of education: 70 + CURRENTLY ENROLLED IN  COLLEGE   Highest education level: Not on file  Occupational History   Occupation: NAIL TECHNICIAN  Tobacco Use   Smoking status: Never   Smokeless tobacco: Never  Vaping Use   Vaping Use: Every day  Substance and Sexual Activity   Alcohol use: Yes    Comment: OCCASIONAL   Drug use: Never   Sexual activity: Yes    Birth control/protection: None  Other Topics Concern   Not on file  Social History Narrative   Not on file   Social Determinants of Health   Financial Resource Strain: Not on file  Food Insecurity: Not on file  Transportation Needs: Not on file  Physical Activity: Not on file  Stress: Not on file  Social Connections: Not on file  Intimate Partner Violence: Not on file    FAMILY HISTORY Family History  Problem Relation Age of Onset   Heart disease Mother    Heart attack Mother     ALLERGIES:  is allergic to magnesium-containing compounds.  MEDICATIONS:  Current Outpatient Medications  Medication Sig Dispense Refill   cyanocobalamin (VITAMIN B12) 500 MCG tablet Take 500 mcg by mouth daily.     escitalopram (LEXAPRO) 10 MG tablet Take 10 mg by mouth daily. (Patient not taking: Reported on 03/07/2023)     ferrous sulfate 325 (65 FE) MG EC tablet Take 325 mg by mouth 3 (three) times daily  with meals.     Vitamin D, Ergocalciferol, (DRISDOL) 1.25 MG (50000 UNIT) CAPS capsule Take 50,000 Units by mouth once a week. (Patient not taking: Reported on 03/07/2023)     No current facility-administered medications for this visit.    PHYSICAL EXAMINATION:  ECOG PERFORMANCE STATUS: 1 - Symptomatic but completely ambulatory   There were no vitals filed for this visit.   There were no vitals filed for this visit.    Physical Exam Vitals and nursing note reviewed.  Constitutional:      General: She is not in acute distress.    Appearance: Normal appearance. She is normal weight. She is not ill-appearing, toxic-appearing or diaphoretic.     Comments: Here with  mother  HENT:     Head: Normocephalic and atraumatic.     Right Ear: External ear normal.     Left Ear: External ear normal.     Nose: Nose normal. No congestion or rhinorrhea.  Eyes:     General: No scleral icterus.    Extraocular Movements: Extraocular movements intact.     Conjunctiva/sclera: Conjunctivae normal.     Pupils: Pupils are equal, round, and reactive to light.  Cardiovascular:     Rate and Rhythm: Normal rate and regular rhythm.     Heart sounds: No murmur heard.    No friction rub. No gallop.  Pulmonary:     Effort: Pulmonary effort is normal. No respiratory distress.     Breath sounds: Normal breath sounds. No stridor.  Abdominal:     General: Bowel sounds are normal.     Palpations: Abdomen is soft.     Tenderness: There is no abdominal tenderness.     Hernia: No hernia is present.  Musculoskeletal:        General: No swelling, tenderness or deformity.     Cervical back: Normal range of motion and neck supple. No rigidity or tenderness.     Right lower leg: No edema.     Left lower leg: No edema.  Lymphadenopathy:     Head:     Right side of head: No submental, submandibular, tonsillar, preauricular, posterior auricular or occipital adenopathy.     Left side of head: No submental, submandibular, tonsillar, preauricular, posterior auricular or occipital adenopathy.     Cervical: No cervical adenopathy.     Right cervical: No superficial, deep or posterior cervical adenopathy.    Left cervical: No superficial, deep or posterior cervical adenopathy.     Upper Body:     Right upper body: No supraclavicular, axillary, pectoral or epitrochlear adenopathy.     Left upper body: No supraclavicular, axillary, pectoral or epitrochlear adenopathy.  Skin:    General: Skin is warm.     Coloration: Skin is not jaundiced or pale.  Neurological:     General: No focal deficit present.     Mental Status: She is alert and oriented to person, place, and time.     Cranial  Nerves: No cranial nerve deficit.     Motor: No weakness.  Psychiatric:        Mood and Affect: Mood normal.        Behavior: Behavior normal.        Thought Content: Thought content normal.        Judgment: Judgment normal.     LABORATORY DATA: I have personally reviewed the data as listed:  No visits with results within 1 Month(s) from this visit.  Latest known visit with results is:  Clinical Support on 03/22/2023  Component Date Value Ref Range Status   Preg, Serum 03/22/2023 NEGATIVE  NEGATIVE Final   Comment:        THE SENSITIVITY OF THIS METHODOLOGY IS >10 mIU/mL. Performed at [facility], [address], [place], [place] [address]    Coagulation Factor VIII 03/22/2023 63  56 - 140 % Final   Ristocetin Co-factor, Plasma 03/22/2023 37 (L)  50 - 200 % Final   Comment: (NOTE) The VWF:RCo assay demonstrates significant variability and slightly low values are commonly seen due to preanalytical and analytical variables.  VWF:RCo may be spuriously low in individuals with certain polymorphisms in the VWF gene (e.g. [id number]) that alter the binding of ristocetin to VWF.  These polymorphisms have been reported in 17% of whites and 63% of African Americans without a history of a bleeding disorder (Blood.  2010; 116(2):280-286). Performed At: [facility] [address] [place], [place] [id number] Jolene Schimke MD AO:[id number]    Von Willebrand Antigen, Plasma 03/22/2023 56  50 - 200 % Final   Comment: (NOTE) This test was developed and its performance characteristics determined by [facility]. It has not been cleared or approved by the Food and Drug Administration.    Ferritin 03/22/2023 3 (L)  11 - 307 ng/mL Final   Performed at [facility], [address] [place], [place] [address]   Von Willebrand Multimers 03/22/2023 Comment   Final   Comment: (NOTE) VWF multimer analysis demonstrates a normal pattern and distribution  of bands. This is the pattern of multimers that occurs in normal individuals as well as in those with type 1 von Willebrand disease (VWD), type 18M and type 2N VWD. Some acquired von Willebrand syndrome cases may yield a normal pattern as well. No additional results available for further interpretation. This test was developed and its performance characteristics determined by [facility].  It has not been cleared or approved by the Food and Drug Administration. Performed At: [facility] [address] [place], [place] [id number] Ethel Rana MD VO:[id number]    Interpretation 03/22/2023 Note   Final   Comment: (NOTE) ------------------------------- COAGULATION: VON WILLEBRAND FACTOR ASSESSMENT CURRENT RESULTS ASSESSMENT The VWF:Ag is normal. The VWF:RCo is slightly decreased. The FVIII is normal. VON WILLEBRAND FACTOR ASSESSMENT CURRENT RESULTS INTERPRETATION - Although the results are abnormal, the panel does not meet the laboratory criteria for VWD. The 2008 NHLBI VWD guideline (summary in Am J Hematol. 2009; 84(6):366-370) suggests that a diagnosis of VWD (excluding type 2N) be reserved for patients with VWF levels that fall below 30%. Results in the range of 30 to 50% may be associated with an increased risk for bleeding, and do not preclude a VWD diagnosis if supporting clinical history is present, nor preclude therapy to elevate VWF levels with a clinical bleeding risk. VWF:RCo/VWF:Ag ratio is less than 0.7 and for this reason type 2 VWD, including type 2A, 2B, and 18M, or platelet-type VWD cannot be excluded. Distinction between type 1 and type 2 VWD should not be                           made on VWF:RCo/VWF:Ag ratios only as there may be significant overlap between type 1 and type 2 VWD especially when ratios are between 0.5 and 0.7. This evaluation does not distinguish congenital from acquired von Willebrand syndrome (e.g. secondary to hypothyroidism,  lymphoproliferative disorders, certain cardiac conditions associated with  increased shear stress). VON WILLEBRAND FACTOR ASSESSMENT - VWF and FVIII levels vary with ABO blood group with the lowest levels occurring in patients with type O. The lower limit of the reference interval in persons with type O is approximately 40%. The VWF:RCo assay demonstrates significant variability and falsely low values are commonly seen due to preanalytical and analytical variables. VWF:RCo may be spuriously low in individuals with polymorphisms (reported in 17% of whites and 63% of African Americans) in the VWF gene (clinical testing not available) that alter the binding of ristocetin to VWF. (Blood. 2010; 116(2):280-286                          ). VWF activity can also be measured using a collagen binding assay, which is labeled for research use only, but does not show interference due to these polymorphisms. Results may be falsely elevated and possibly falsely normal as VWF and FVIII may increase in pregnancy, in samples drawn from patients (particularly children) who are visibly stressed at the time of phlebotomy, as acute phase reactants, or in response to certain drug therapies such as desmopressin. Repeat testing may be necessary before excluding a diagnosis of VWD especially if the clinical suspicion is high for an underlying bleeding disorder. The setting for phlebotomy should be as calm as possible and patients should be encouraged to sit quietly prior to the blood draw. VON WILLEBRAND FACTOR ASSESSMENT CUMULATIVE RESULTS SUMMARY - Interpretation is based on current and most recent VWF Assessment profiles performed at [facility]. While the results of VWF testing are abnormal on two consecutive occasions                          , results do not meet the criteria for diagnosis based on the 2008 NHLBI VWD guideline (summary in Am J Hematol. 2009; 16:109-604). Results in the range of 30 to 50%  may be associated with an increased risk for bleeding, and do not preclude a VWD diagnosis if supporting clinical history is present, nor preclude therapy to elevate VWF levels with a clinical bleeding risk. VON WILLEBRAND FACTOR ASSESSMENT DEFINITIONS - VWD - von Willebrand disease; VWF - von Willebrand factor; VWF:Ag - VWF antigen; VWF:RCo - VWF ristocetin cofactor activity; FVIII - factor VIII activity. MEDICAL DIRECTOR: For questions regarding panel interpretation, please contact Lebron Conners, M.D. at [facility] at [phone]. ------------------------------- DISCLAIMER These assessments and interpretations are provided as a convenience in support of the physician-patient relationship and are not intended to replace the physician's clinical judgment. They are derived from nat                          ional guidelines in addition to other evidence and expert opinion. The clinician should consider this information within the context of clinical opinion and the individual patient. SEE GUIDANCE FOR VON WILLEBRAND FACTOR ASSESSMENT: (1) The National Heart, Lung and Blood Institute. The Diagnosis, Evaluation and Management of von Willebrand Disease. Lafe Garin, MD: [employer] Publication [phone]. 2007. Available at http://kemp.com/. (2) Annie Sable et al. Othella Boyer J Hematol. 2009; 84(6):366-370. (3) Laffan M et al. Haemophilia. 2004;10(3):199-217. (4) Pasi KJ et al. Haemophilia. 2004; 10(3):218-231. Performed At: [facility] [address] [place], [place] [id number] Blanchie Serve MD ON:[id number]     RADIOGRAPHIC STUDIES: I have personally reviewed the radiological images as listed and agree with the findings in  the report  No results found.  ASSESSMENT/PLAN 22 y.o. female is here because of anemia.  Medical history notable for menorrhagia and postpartum hemorrhage  Anemia:  Mainly due to iron  deficiency anemia owing to dysfunctional uterine bleeding/ exacerbated by high demand owing from prior pregnancy and postpartum hemorrhage.  Patient also has family history of thalassemia.  Has required IV iron in the past and Hgb corrected with this Mar 22 2023- .  Arranging for IV iron due to sx of IDA which includes fatigue and pica.  Ferritin 3 Hgb 13.9   Mar 29 2023:  Feraheme 510 mg Apr 06 2023:   Feraheme 510 mg May 17 2023- Hgb 13.6 Ferritin 184  Dysfunctional uterine bleeding:  This is characterized by menses that are prolonged, irregular as well as by heavy bleeding with passage of clots.   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  To start Tranexamic acid 1.3 grams tid x 5 days to start with next meses  May 17 2023- Not using tranexamic acid because menstrual bleeding resolved with Depot Provera.  Von Willebrand Disease, Type I:   Mar 22 2023- Patient does not meet the strict laboratory criteria but has clinical criteria which are consistent 1) menorrhagia 2) postpartum hemorrhage 3) gingival bleeding 4) easy bruising.  Of note also has family history of heavy menstrual bleeding (mother, sister)    May 17 2023- Not using tranexamic acid because menstrual bleeding resolved with Depot Provera.     Cancer Staging  No matching staging information was found for the patient.   No problem-specific Assessment & Plan notes found for this encounter.    No orders of the defined types were placed in this encounter.   30  minutes was spent in patient care.  This included time spent preparing to see the patient (e.g., review of tests), obtaining and/or reviewing separately obtained history, counseling and educating the  patient/family/caregiver, ordering medications, tests, or procedures; documenting clinical information in the electronic or other health record, independently interpreting results and communicating results to the patient/family/caregiver as well as coordination of care.       All questions were answered. The patient knows to call the clinic with any problems, questions or concerns.  This note was electronically signed.    Loni Muse, MD  05/16/2023 12:54 PM

## 2023-05-17 ENCOUNTER — Inpatient Hospital Stay (INDEPENDENT_AMBULATORY_CARE_PROVIDER_SITE_OTHER): Payer: Medicaid Other | Admitting: Oncology

## 2023-05-17 ENCOUNTER — Inpatient Hospital Stay: Payer: Medicaid Other | Attending: Hematology and Oncology

## 2023-05-17 DIAGNOSIS — N920 Excessive and frequent menstruation with regular cycle: Secondary | ICD-10-CM | POA: Diagnosis present

## 2023-05-17 DIAGNOSIS — N938 Other specified abnormal uterine and vaginal bleeding: Secondary | ICD-10-CM

## 2023-05-17 DIAGNOSIS — D500 Iron deficiency anemia secondary to blood loss (chronic): Secondary | ICD-10-CM

## 2023-05-17 DIAGNOSIS — D6801 Von willebrand disease, type 1: Secondary | ICD-10-CM | POA: Diagnosis not present

## 2023-05-17 LAB — CBC WITH DIFFERENTIAL/PLATELET
Abs Immature Granulocytes: 0.02 10*3/uL (ref 0.00–0.07)
Basophils Absolute: 0 10*3/uL (ref 0.0–0.1)
Basophils Relative: 1 %
Eosinophils Absolute: 0.1 10*3/uL (ref 0.0–0.5)
Eosinophils Relative: 2 %
HCT: 41.5 % (ref 36.0–46.0)
Hemoglobin: 13.6 g/dL (ref 12.0–15.0)
Immature Granulocytes: 0 %
Lymphocytes Relative: 26 %
Lymphs Abs: 1.2 10*3/uL (ref 0.7–4.0)
MCH: 29.8 pg (ref 26.0–34.0)
MCHC: 32.8 g/dL (ref 30.0–36.0)
MCV: 90.8 fL (ref 80.0–100.0)
Monocytes Absolute: 0.3 10*3/uL (ref 0.1–1.0)
Monocytes Relative: 8 %
Neutro Abs: 2.8 10*3/uL (ref 1.7–7.7)
Neutrophils Relative %: 63 %
Platelets: 274 10*3/uL (ref 150–400)
RBC: 4.57 MIL/uL (ref 3.87–5.11)
RDW: 13.9 % (ref 11.5–15.5)
WBC: 4.5 10*3/uL (ref 4.0–10.5)
nRBC: 0 % (ref 0.0–0.2)

## 2023-05-17 LAB — FERRITIN: Ferritin: 184 ng/mL (ref 11–307)

## 2023-05-18 ENCOUNTER — Telehealth: Payer: Self-pay | Admitting: Oncology

## 2023-05-18 NOTE — Telephone Encounter (Signed)
Patient has been scheduled. Aware of appt date and time   Message Received: Isaiah Blakes, CMA sent to [employer] Scheduling 3 months with labs

## 2023-05-25 ENCOUNTER — Encounter: Payer: Self-pay | Admitting: Oncology

## 2023-06-30 IMAGING — CR DG CHEST 2V
2 series · 2 of 2 positions shown · non-contrast
Comparison: No priors.

CLINICAL DATA: 20-year-old female with history of shortness of
breath.

EXAM:
CHEST - 2 VIEW

[chest pa]
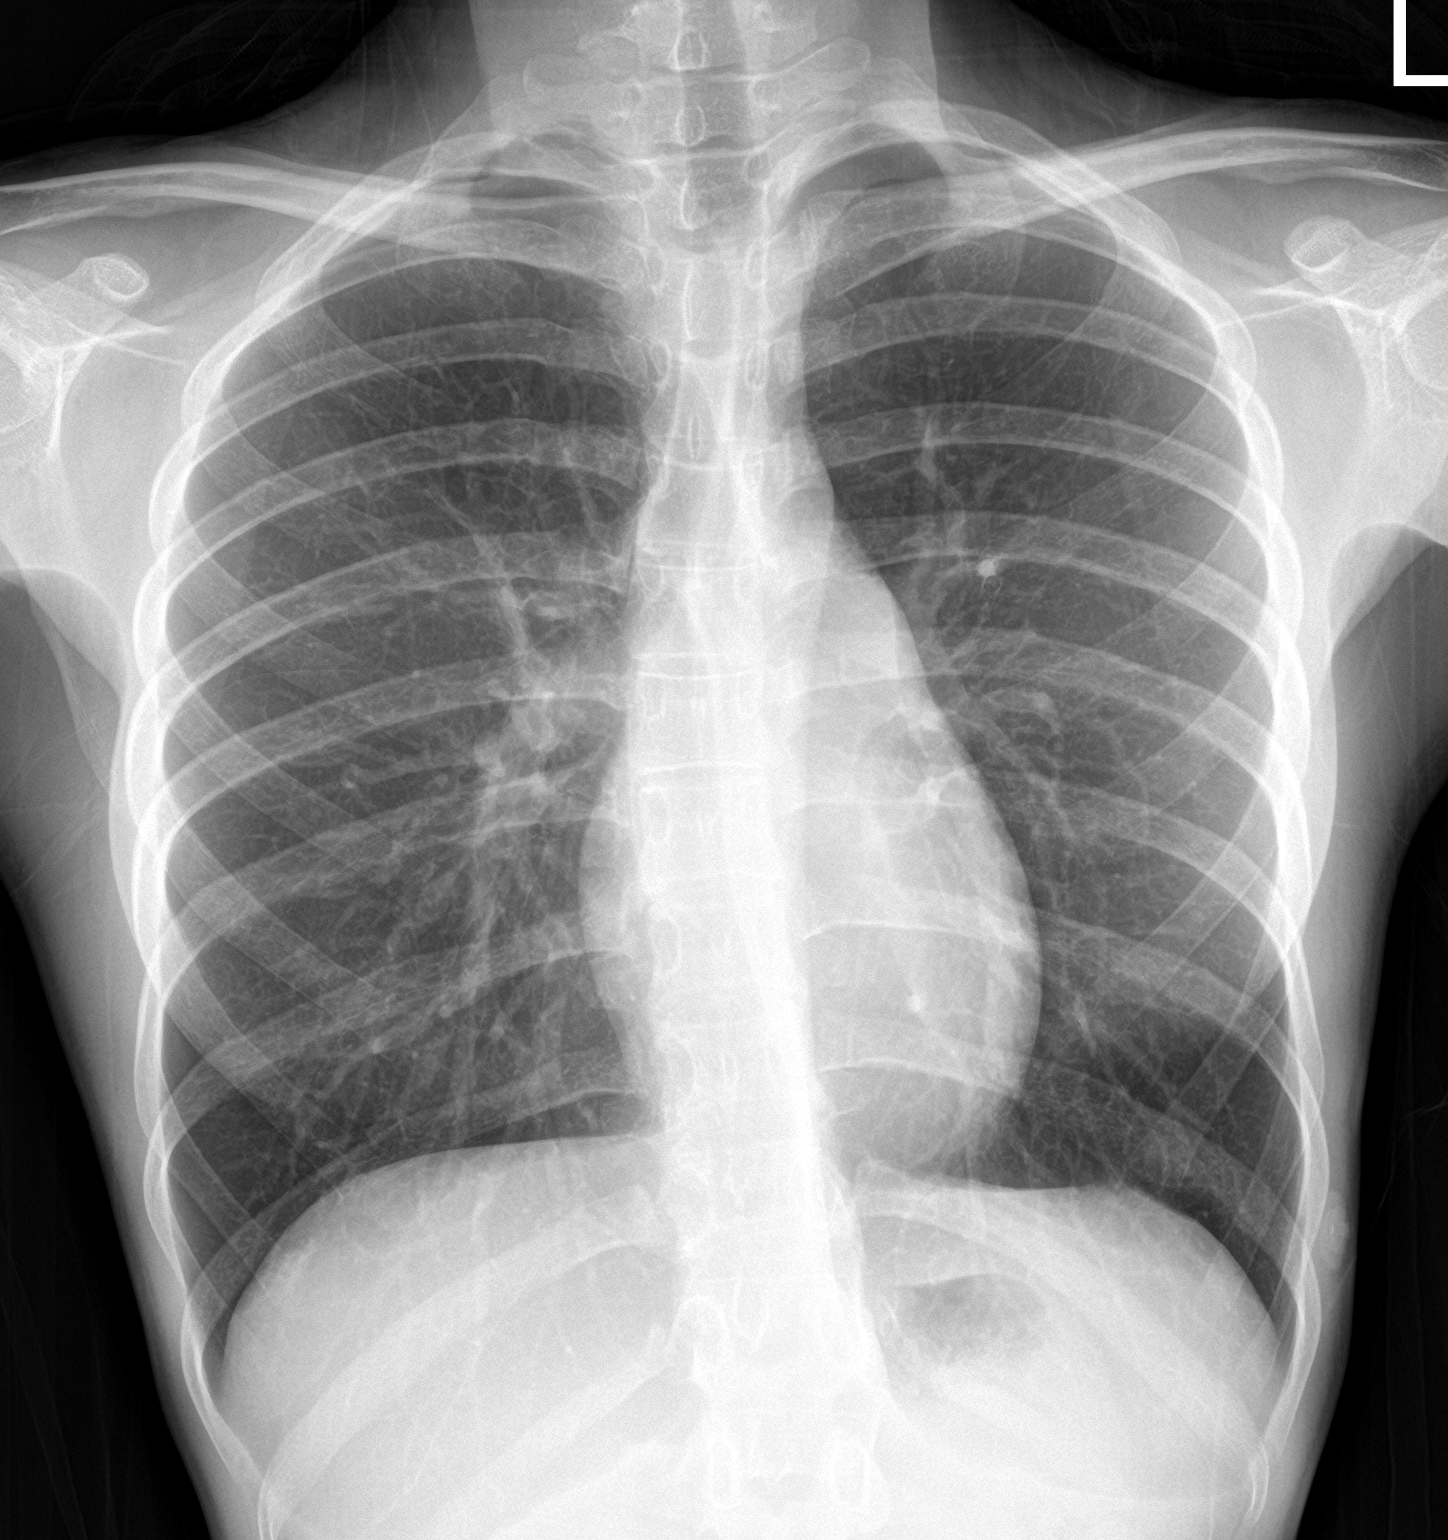

[chest lat]
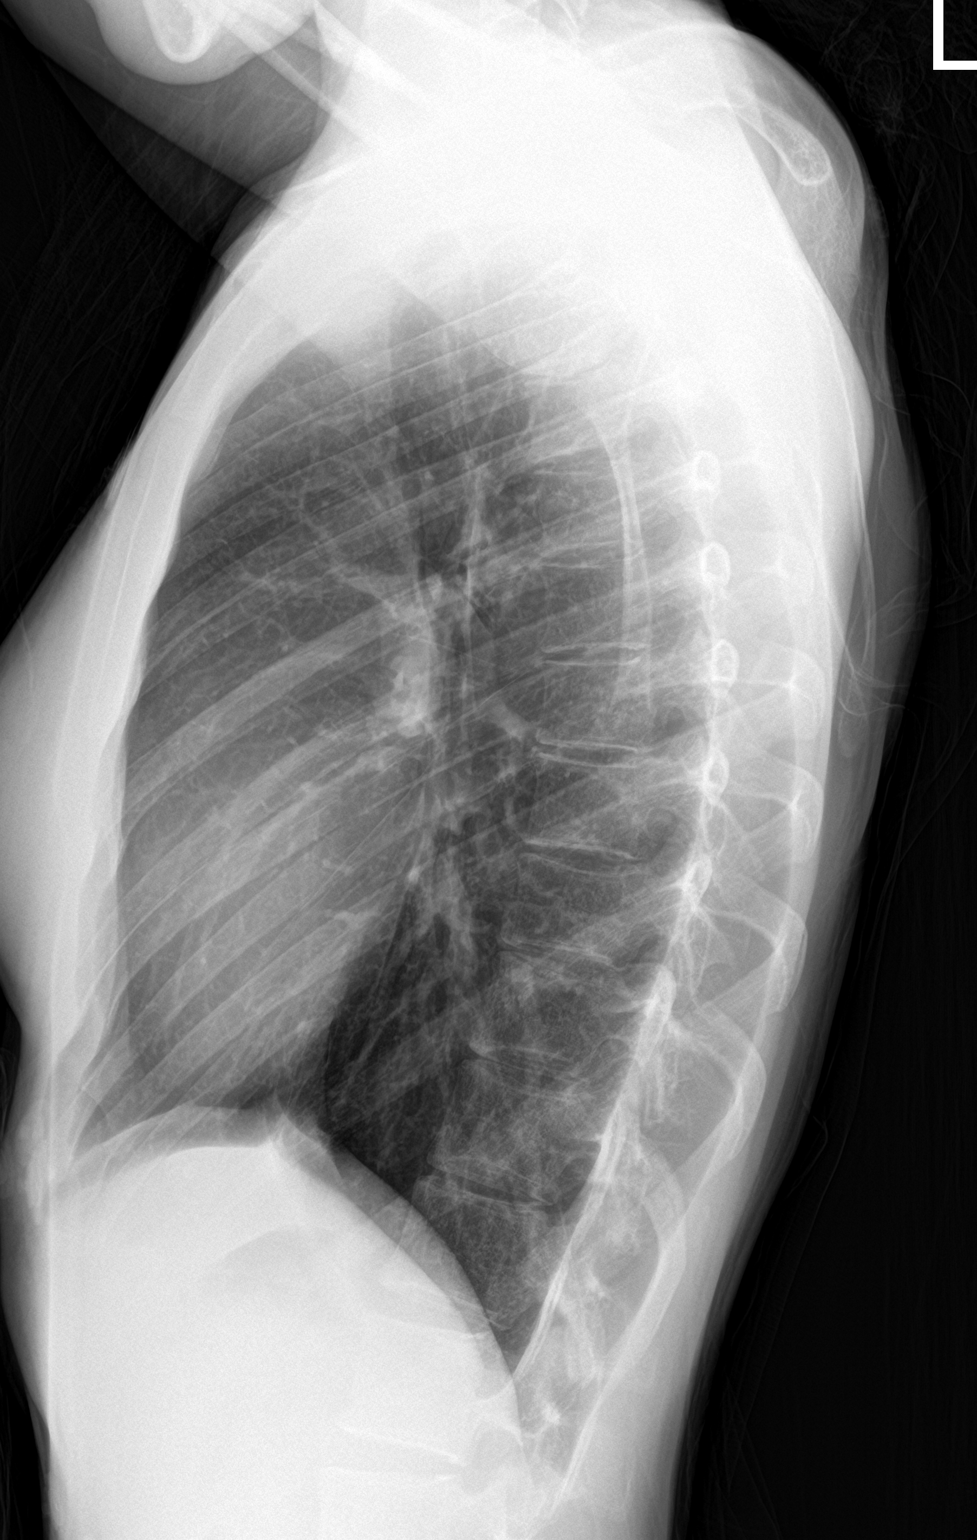

[2 of 2 positions shown; findings below may reference images not displayed]

FINDINGS: Lung volumes are normal. No consolidative airspace disease. No
pleural effusions. No pneumothorax. No pulmonary nodule or mass
noted. Pulmonary vasculature and the cardiomediastinal silhouette
are within normal limits.
IMPRESSION: No radiographic evidence of acute cardiopulmonary disease.

## 2023-08-17 ENCOUNTER — Inpatient Hospital Stay: Payer: Medicaid Other

## 2023-08-17 ENCOUNTER — Inpatient Hospital Stay: Payer: Medicaid Other | Admitting: Oncology

## 2023-08-17 NOTE — Progress Notes (Deleted)
[facility] Cancer Follow up Visit:  Patient Care Team: Lars Mage, NP as PCP - General (Nurse Practitioner)  CHIEF COMPLAINTS/PURPOSE OF CONSULTATION:   HISTORY OF PRESENTING ILLNESS: Joanna Oliver 22 y.o. female is here because of anemia Patient has no significant past medical history  March 07, 2023 WBC 5.2 hemoglobin 13.5 MCV 88 platelet count 284 Ferritin 6 folate 7.5 B12 184 Factor VIII 52 von Willebrand antigen 57 ristocetin cofactor 52 Fibrinogen 286 D-dimer undetectable INR 1.0 PTT 33  Mar 22 2023:    Menses occur q 2 to 3 weeks and last 7 to 10 days.  Bleeding is heavy.  Not currently on depot; was on it in the past but stopped because was contemplating having children and also because she had breakthrough bleeding that lasted 3 months.  Having ice pica but not starch/dirt.  No epistaxis but has occasional gingival bleeding if brushes or flosses.  Bruises easily.  Not taking ASA or NSAIDS.  Patient had hemorrhage following delivery of her first child.  Patient's mother and sister also have heavy menses.    Ferritin 3 Hgb 13.9  Mar 29 2023:  Feraheme 510 mg Apr 06 2023:   Feraheme 510 mg  May 17 2023:  Scheduled follow up regarding iron deficiency anemia.  Restarted Depot Provera since last visit with resolution of menstrual bleeding.  Has not used Tranexamic acid.  Taking PNV every other day.  Still a bit fatigued.  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 Anemia   . Iron deficiency anemia due to chronic blood loss 06/11/2022    SURGICAL HISTORY: Past Surgical History:  Procedure Laterality Date  . WISDOM TOOTH EXTRACTION  2017    SOCIAL HISTORY: Social History   Socioeconomic History  . Marital status: Single    Spouse name: Not on file  . Number of children: 1  . Years of education: 32 + CURRENTLY ENROLLED IN  [place]  . Highest education level: Not on file  Occupational History  . Occupation: NAIL TECHNICIAN  Tobacco Use  . Smoking status: Never  . Smokeless tobacco: Never  Vaping Use  . Vaping status: Every Day  Substance and Sexual Activity  . Alcohol use: Yes    Comment: OCCASIONAL  . Drug use: Never  . Sexual activity: Yes    Birth control/protection: None  Other Topics Concern  . Not on file  Social History Narrative  . Not on file   Social Determinants of Health   Financial Resource Strain: Not on file  Food Insecurity: Not on file  Transportation Needs: Not on file  Physical Activity: Not on file  Stress: Not on file  Social Connections: Not on file  Intimate Partner Violence: Not on file    FAMILY HISTORY Family History  Problem Relation Age of Onset  . Heart disease Mother   . Heart attack Mother     ALLERGIES:  is allergic to magnesium-containing compounds.  MEDICATIONS:  Current Outpatient Medications  Medication Sig Dispense Refill  . cyanocobalamin (VITAMIN B12) 500 MCG tablet Take 500 mcg by mouth daily.    . ferrous sulfate 325 (65 FE) MG EC tablet Take 325 mg by mouth 3 (three) times daily with meals.    . medroxyPROGESTERone Acetate 150 MG/ML SUSY Inject into the muscle every 3 (three) months.    Marland Kitchen  sulfamethoxazole-trimethoprim (BACTRIM DS) 800-160 MG tablet Take 1 tablet by mouth 2 (two) times daily.    . Vitamin D, Ergocalciferol, (DRISDOL) 1.25 MG (50000 UNIT) CAPS capsule Take 50,000 Units by mouth once a week. (Patient not taking: Reported on 03/07/2023)     No current facility-administered medications for this visit.    PHYSICAL EXAMINATION:  ECOG PERFORMANCE STATUS: 1 - Symptomatic but completely ambulatory   There were no vitals filed for this visit.   There were no vitals filed for this visit.    Physical Exam Vitals and nursing note reviewed.  Constitutional:      General: She is not in acute distress.    Appearance: Normal  appearance. She is normal weight. She is not ill-appearing, toxic-appearing or diaphoretic.     Comments: Here with mother  HENT:     Head: Normocephalic and atraumatic.     Right Ear: External ear normal.     Left Ear: External ear normal.     Nose: Nose normal. No congestion or rhinorrhea.  Eyes:     General: No scleral icterus.    Extraocular Movements: Extraocular movements intact.     Conjunctiva/sclera: Conjunctivae normal.     Pupils: Pupils are equal, round, and reactive to light.  Cardiovascular:     Rate and Rhythm: Normal rate and regular rhythm.     Heart sounds: No murmur heard.    No friction rub. No gallop.  Pulmonary:     Effort: Pulmonary effort is normal. No respiratory distress.     Breath sounds: Normal breath sounds. No stridor.  Abdominal:     General: Bowel sounds are normal.     Palpations: Abdomen is soft.     Tenderness: There is no abdominal tenderness.     Hernia: No hernia is present.  Musculoskeletal:        General: No swelling, tenderness or deformity.     Cervical back: Normal range of motion and neck supple. No rigidity or tenderness.     Right lower leg: No edema.     Left lower leg: No edema.  Lymphadenopathy:     Head:     Right side of head: No submental, submandibular, tonsillar, preauricular, posterior auricular or occipital adenopathy.     Left side of head: No submental, submandibular, tonsillar, preauricular, posterior auricular or occipital adenopathy.     Cervical: No cervical adenopathy.     Right cervical: No superficial, deep or posterior cervical adenopathy.    Left cervical: No superficial, deep or posterior cervical adenopathy.     Upper Body:     Right upper body: No supraclavicular, axillary, pectoral or epitrochlear adenopathy.     Left upper body: No supraclavicular, axillary, pectoral or epitrochlear adenopathy.  Skin:    General: Skin is warm.     Coloration: Skin is not jaundiced or pale.  Neurological:     General:  No focal deficit present.     Mental Status: She is alert and oriented to person, place, and time.     Cranial Nerves: No cranial nerve deficit.     Motor: No weakness.  Psychiatric:        Mood and Affect: Mood normal.        Behavior: Behavior normal.        Thought Content: Thought content normal.        Judgment: Judgment normal.    LABORATORY DATA: I have personally reviewed the data as listed:  No visits with results within  1 Month(s) from this visit.  Latest known visit with results is:  Appointment on 05/17/2023  Component Date Value Ref Range Status  . Ferritin 05/17/2023 184  11 - 307 ng/mL Final   Performed at [facility], [address], [place], [place] [address]    RADIOGRAPHIC STUDIES: I have personally reviewed the radiological images as listed and agree with the findings in the report  No results found.  ASSESSMENT/PLAN 22 y.o. female is here because of anemia.  Medical history notable for menorrhagia and postpartum hemorrhage  Anemia:  Mainly due to iron deficiency anemia owing to dysfunctional uterine bleeding/ exacerbated by high demand owing from prior pregnancy and postpartum hemorrhage.  Patient also has family history of thalassemia.  Has required IV iron in the past and Hgb corrected with this Mar 22 2023- .  Arranging for IV iron due to sx of IDA which includes fatigue and pica.  Ferritin 3 Hgb 13.9   Mar 29 2023:  Feraheme 510 mg Apr 06 2023:   Feraheme 510 mg May 17 2023- Hgb 13.6 Ferritin 184  Dysfunctional uterine bleeding:  This is characterized by menses that are prolonged, irregular as well as by heavy bleeding with passage of clots.   March 07 2023- von Willebrand screen  Factor VIII 52 von Willebrand antigen 57 ristocetin cofactor 52  Mar 21 2023- Will repeat von Willebrand screen and add vWF multimer analysis to confirm probable dx of Type I VWD Referring patient to gynecology for assistance in managing the frequency of  her menses.  To start Tranexamic acid 1.3 grams tid x 5 days to start with next meses  May 17 2023- Not using tranexamic acid because menstrual bleeding resolved with Depot Provera.  Von Willebrand Disease, Type I:   Mar 22 2023- Patient does not meet the strict laboratory criteria but has clinical criteria which are consistent 1) menorrhagia 2) postpartum hemorrhage 3) gingival bleeding 4) easy bruising.  Of note also has family history of heavy menstrual bleeding (mother, sister)    May 17 2023- Not using tranexamic acid because menstrual bleeding resolved with Depot Provera.     Cancer Staging  No matching staging information was found for the patient.    No problem-specific Assessment & Plan notes found for this encounter.    No orders of the defined types were placed in this encounter.   30  minutes was spent in patient care.  This included time spent preparing to see the patient (e.g., review of tests), obtaining and/or reviewing separately obtained history, counseling and educating the patient/family/caregiver, ordering medications, tests, or procedures; documenting clinical information in the electronic or other health record, independently interpreting results and communicating results to the patient/family/caregiver as well as coordination of care.       All questions were answered. The patient knows to call the clinic with any problems, questions or concerns.  This note was electronically signed.    Loni Muse, MD  08/17/2023 8:30 AM

## 2023-08-29 ENCOUNTER — Inpatient Hospital Stay: Payer: Medicaid Other | Attending: Oncology | Admitting: Oncology

## 2023-08-29 ENCOUNTER — Inpatient Hospital Stay: Payer: Medicaid Other

## 2023-08-29 NOTE — Progress Notes (Deleted)
[facility] Cancer Follow up Visit:  Patient Care Team: Lars Mage, NP as PCP - General (Nurse Practitioner)  CHIEF COMPLAINTS/PURPOSE OF CONSULTATION:   HISTORY OF PRESENTING ILLNESS: Joanna Oliver 22 y.o. female is here because of anemia Patient has no significant past medical history  March 07, 2023 WBC 5.2 hemoglobin 13.5 MCV 88 platelet count 284 Ferritin 6 folate 7.5 B12 184 Factor VIII 52 von Willebrand antigen 57 ristocetin cofactor 52 Fibrinogen 286 D-dimer undetectable INR 1.0 PTT 33  Mar 22 2023:    Menses occur q 2 to 3 weeks and last 7 to 10 days.  Bleeding is heavy.  Not currently on depot; was on it in the past but stopped because was contemplating having children and also because she had breakthrough bleeding that lasted 3 months.  Having ice pica but not starch/dirt.  No epistaxis but has occasional gingival bleeding if brushes or flosses.  Bruises easily.  Not taking ASA or NSAIDS.  Patient had hemorrhage following delivery of her first child.  Patient's mother and sister also have heavy menses.    Ferritin 3 Hgb 13.9  Mar 29 2023:  Feraheme 510 mg Apr 06 2023:   Feraheme 510 mg  May 17 2023:  Scheduled follow up regarding iron deficiency anemia.  Restarted Depot Provera since last visit with resolution of menstrual bleeding.  Has not used Tranexamic acid.  Taking PNV every other day.  Still a bit fatigued.  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 Anemia   . Iron deficiency anemia due to chronic blood loss 06/11/2022    SURGICAL HISTORY: Past Surgical History:  Procedure Laterality Date  . WISDOM TOOTH EXTRACTION  2017    SOCIAL HISTORY: Social History   Socioeconomic History  . Marital status: Single    Spouse name: Not on file  . Number of children: 1  . Years of education: 58 + CURRENTLY ENROLLED IN  [place]  . Highest education level: Not on file  Occupational History  . Occupation: NAIL TECHNICIAN  Tobacco Use  . Smoking status: Never  . Smokeless tobacco: Never  Vaping Use  . Vaping status: Every Day  Substance and Sexual Activity  . Alcohol use: Yes    Comment: OCCASIONAL  . Drug use: Never  . Sexual activity: Yes    Birth control/protection: None  Other Topics Concern  . Not on file  Social History Narrative  . Not on file   Social Determinants of Health   Financial Resource Strain: Not on file  Food Insecurity: Not on file  Transportation Needs: Not on file  Physical Activity: Not on file  Stress: Not on file  Social Connections: Not on file  Intimate Partner Violence: Not on file    FAMILY HISTORY Family History  Problem Relation Age of Onset  . Heart disease Mother   . Heart attack Mother     ALLERGIES:  is allergic to magnesium-containing compounds.  MEDICATIONS:  Current Outpatient Medications  Medication Sig Dispense Refill  . cyanocobalamin (VITAMIN B12) 500 MCG tablet Take 500 mcg by mouth daily.    . ferrous sulfate 325 (65 FE) MG EC tablet Take 325 mg by mouth 3 (three) times daily with meals.    . medroxyPROGESTERone Acetate 150 MG/ML SUSY Inject into the muscle every 3 (three) months.    Marland Kitchen  sulfamethoxazole-trimethoprim (BACTRIM DS) 800-160 MG tablet Take 1 tablet by mouth 2 (two) times daily.    . Vitamin D, Ergocalciferol, (DRISDOL) 1.25 MG (50000 UNIT) CAPS capsule Take 50,000 Units by mouth once a week. (Patient not taking: Reported on 03/07/2023)     No current facility-administered medications for this visit.    PHYSICAL EXAMINATION:  ECOG PERFORMANCE STATUS: 1 - Symptomatic but completely ambulatory   There were no vitals filed for this visit.   There were no vitals filed for this visit.    Physical Exam Vitals and nursing note reviewed.  Constitutional:      General: She is not in acute distress.    Appearance: Normal  appearance. She is normal weight. She is not ill-appearing, toxic-appearing or diaphoretic.     Comments: Here with mother  HENT:     Head: Normocephalic and atraumatic.     Right Ear: External ear normal.     Left Ear: External ear normal.     Nose: Nose normal. No congestion or rhinorrhea.  Eyes:     General: No scleral icterus.    Extraocular Movements: Extraocular movements intact.     Conjunctiva/sclera: Conjunctivae normal.     Pupils: Pupils are equal, round, and reactive to light.  Cardiovascular:     Rate and Rhythm: Normal rate and regular rhythm.     Heart sounds: No murmur heard.    No friction rub. No gallop.  Pulmonary:     Effort: Pulmonary effort is normal. No respiratory distress.     Breath sounds: Normal breath sounds. No stridor.  Abdominal:     General: Bowel sounds are normal.     Palpations: Abdomen is soft.     Tenderness: There is no abdominal tenderness.     Hernia: No hernia is present.  Musculoskeletal:        General: No swelling, tenderness or deformity.     Cervical back: Normal range of motion and neck supple. No rigidity or tenderness.     Right lower leg: No edema.     Left lower leg: No edema.  Lymphadenopathy:     Head:     Right side of head: No submental, submandibular, tonsillar, preauricular, posterior auricular or occipital adenopathy.     Left side of head: No submental, submandibular, tonsillar, preauricular, posterior auricular or occipital adenopathy.     Cervical: No cervical adenopathy.     Right cervical: No superficial, deep or posterior cervical adenopathy.    Left cervical: No superficial, deep or posterior cervical adenopathy.     Upper Body:     Right upper body: No supraclavicular, axillary, pectoral or epitrochlear adenopathy.     Left upper body: No supraclavicular, axillary, pectoral or epitrochlear adenopathy.  Skin:    General: Skin is warm.     Coloration: Skin is not jaundiced or pale.  Neurological:     General:  No focal deficit present.     Mental Status: She is alert and oriented to person, place, and time.     Cranial Nerves: No cranial nerve deficit.     Motor: No weakness.  Psychiatric:        Mood and Affect: Mood normal.        Behavior: Behavior normal.        Thought Content: Thought content normal.        Judgment: Judgment normal.    LABORATORY DATA: I have personally reviewed the data as listed:  No visits with results within  1 Month(s) from this visit.  Latest known visit with results is:  Appointment on 05/17/2023  Component Date Value Ref Range Status  . Ferritin 05/17/2023 184  11 - 307 ng/mL Final   Performed at [facility], [address], [place], [place] [address]    RADIOGRAPHIC STUDIES: I have personally reviewed the radiological images as listed and agree with the findings in the report  No results found.  ASSESSMENT/PLAN 22 y.o. female is here because of anemia.  Medical history notable for menorrhagia and postpartum hemorrhage  Anemia:  Mainly due to iron deficiency anemia owing to dysfunctional uterine bleeding/ exacerbated by high demand owing from prior pregnancy and postpartum hemorrhage.  Patient also has family history of thalassemia.  Has required IV iron in the past and Hgb corrected with this Mar 22 2023- .  Arranging for IV iron due to sx of IDA which includes fatigue and pica.  Ferritin 3 Hgb 13.9   Mar 29 2023:  Feraheme 510 mg Apr 06 2023:   Feraheme 510 mg May 17 2023- Hgb 13.6 Ferritin 184  Dysfunctional uterine bleeding:  This is characterized by menses that are prolonged, irregular as well as by heavy bleeding with passage of clots.   March 07 2023- von Willebrand screen  Factor VIII 52 von Willebrand antigen 57 ristocetin cofactor 52  Mar 21 2023- Will repeat von Willebrand screen and add vWF multimer analysis to confirm probable dx of Type I VWD Referring patient to gynecology for assistance in managing the frequency of  her menses.  To start Tranexamic acid 1.3 grams tid x 5 days to start with next meses  May 17 2023- Not using tranexamic acid because menstrual bleeding resolved with Depot Provera.  Von Willebrand Disease, Type I:   Mar 22 2023- Patient does not meet the strict laboratory criteria but has clinical criteria which are consistent 1) menorrhagia 2) postpartum hemorrhage 3) gingival bleeding 4) easy bruising.  Of note also has family history of heavy menstrual bleeding (mother, sister)    May 17 2023- Not using tranexamic acid because menstrual bleeding resolved with Depot Provera.     Cancer Staging  No matching staging information was found for the patient.    No problem-specific Assessment & Plan notes found for this encounter.    No orders of the defined types were placed in this encounter.   30  minutes was spent in patient care.  This included time spent preparing to see the patient (e.g., review of tests), obtaining and/or reviewing separately obtained history, counseling and educating the patient/family/caregiver, ordering medications, tests, or procedures; documenting clinical information in the electronic or other health record, independently interpreting results and communicating results to the patient/family/caregiver as well as coordination of care.       All questions were answered. The patient knows to call the clinic with any problems, questions or concerns.  This note was electronically signed.    Loni Muse, MD  08/29/2023 8:41 AM
